# Patient Record
Sex: Female | Born: 1950 | Race: White | Hispanic: No | Marital: Single | Smoking: Never smoker
Health system: Southern US, Community
[De-identification: ages and names within clinical notes are randomized; demographics above are authoritative.]

## PROBLEM LIST (undated history)

## (undated) DIAGNOSIS — N393 Stress incontinence (female) (male): Secondary | ICD-10-CM

## (undated) DIAGNOSIS — D649 Anemia, unspecified: Secondary | ICD-10-CM

## (undated) DIAGNOSIS — I471 Supraventricular tachycardia, unspecified: Secondary | ICD-10-CM

## (undated) DIAGNOSIS — R9439 Abnormal result of other cardiovascular function study: Secondary | ICD-10-CM

## (undated) DIAGNOSIS — E785 Hyperlipidemia, unspecified: Secondary | ICD-10-CM

## (undated) DIAGNOSIS — I10 Essential (primary) hypertension: Secondary | ICD-10-CM

## (undated) DIAGNOSIS — E119 Type 2 diabetes mellitus without complications: Secondary | ICD-10-CM

## (undated) DIAGNOSIS — G709 Myoneural disorder, unspecified: Secondary | ICD-10-CM

## (undated) DIAGNOSIS — M797 Fibromyalgia: Secondary | ICD-10-CM

## (undated) DIAGNOSIS — M1990 Unspecified osteoarthritis, unspecified site: Secondary | ICD-10-CM

## (undated) DIAGNOSIS — F419 Anxiety disorder, unspecified: Secondary | ICD-10-CM

## (undated) DIAGNOSIS — R002 Palpitations: Secondary | ICD-10-CM

## (undated) DIAGNOSIS — K219 Gastro-esophageal reflux disease without esophagitis: Secondary | ICD-10-CM

## (undated) DIAGNOSIS — C189 Malignant neoplasm of colon, unspecified: Secondary | ICD-10-CM

## (undated) DIAGNOSIS — Z6841 Body Mass Index (BMI) 40.0 and over, adult: Secondary | ICD-10-CM

## (undated) DIAGNOSIS — M8580 Other specified disorders of bone density and structure, unspecified site: Secondary | ICD-10-CM

## (undated) HISTORY — DX: Abnormal result of other cardiovascular function study: R94.39

## (undated) HISTORY — DX: Other specified disorders of bone density and structure, unspecified site: M85.80

## (undated) HISTORY — DX: Supraventricular tachycardia, unspecified: I47.10

## (undated) HISTORY — DX: Essential (primary) hypertension: I10

## (undated) HISTORY — DX: Hyperlipidemia, unspecified: E78.5

## (undated) HISTORY — DX: Palpitations: R00.2

## (undated) HISTORY — PX: BUNIONECTOMY: SHX129

## (undated) HISTORY — DX: Gastro-esophageal reflux disease without esophagitis: K21.9

## (undated) HISTORY — DX: Unspecified osteoarthritis, unspecified site: M19.90

## (undated) HISTORY — DX: Type 2 diabetes mellitus without complications: E11.9

## (undated) HISTORY — DX: Body Mass Index (BMI) 40.0 and over, adult: Z684

## (undated) HISTORY — PX: SHOULDER SURGERY: SHX246

## (undated) HISTORY — PX: NM PET IMG ALZHEIMERS: HXRAD13

## (undated) HISTORY — DX: Myoneural disorder, unspecified: G70.9

## (undated) HISTORY — DX: Supraventricular tachycardia: I47.1

## (undated) HISTORY — DX: Morbid (severe) obesity due to excess calories: E66.01

---

## 1999-07-27 ENCOUNTER — Other Ambulatory Visit: Admission: RE | Admit: 1999-07-27 | Discharge: 1999-07-27 | Payer: Self-pay | Admitting: Obstetrics & Gynecology

## 2000-02-09 ENCOUNTER — Encounter: Payer: Self-pay | Admitting: Emergency Medicine

## 2000-02-09 ENCOUNTER — Emergency Department (HOSPITAL_COMMUNITY): Admission: EM | Admit: 2000-02-09 | Discharge: 2000-02-09 | Payer: Self-pay | Admitting: Emergency Medicine

## 2000-10-03 ENCOUNTER — Other Ambulatory Visit: Admission: RE | Admit: 2000-10-03 | Discharge: 2000-10-03 | Payer: Self-pay | Admitting: Obstetrics & Gynecology

## 2001-09-21 ENCOUNTER — Other Ambulatory Visit: Admission: RE | Admit: 2001-09-21 | Discharge: 2001-09-21 | Payer: Self-pay | Admitting: Dermatology

## 2001-10-25 ENCOUNTER — Other Ambulatory Visit: Admission: RE | Admit: 2001-10-25 | Discharge: 2001-10-25 | Payer: Self-pay | Admitting: Obstetrics & Gynecology

## 2004-05-14 ENCOUNTER — Other Ambulatory Visit: Admission: RE | Admit: 2004-05-14 | Discharge: 2004-05-14 | Payer: Self-pay | Admitting: Obstetrics & Gynecology

## 2005-05-29 ENCOUNTER — Emergency Department (HOSPITAL_COMMUNITY): Admission: EM | Admit: 2005-05-29 | Discharge: 2005-05-29 | Payer: Self-pay | Admitting: Emergency Medicine

## 2005-06-23 ENCOUNTER — Other Ambulatory Visit: Admission: RE | Admit: 2005-06-23 | Discharge: 2005-06-23 | Payer: Self-pay | Admitting: Obstetrics & Gynecology

## 2006-01-12 ENCOUNTER — Ambulatory Visit: Payer: Self-pay | Admitting: Internal Medicine

## 2008-11-16 ENCOUNTER — Emergency Department (HOSPITAL_COMMUNITY): Admission: EM | Admit: 2008-11-16 | Discharge: 2008-11-16 | Payer: Self-pay | Admitting: Emergency Medicine

## 2011-03-03 HISTORY — PX: DOPPLER ECHOCARDIOGRAPHY: SHX263

## 2012-08-29 ENCOUNTER — Ambulatory Visit: Payer: BC Managed Care – PPO | Attending: Neurosurgery

## 2012-08-29 DIAGNOSIS — M25559 Pain in unspecified hip: Secondary | ICD-10-CM | POA: Insufficient documentation

## 2012-08-29 DIAGNOSIS — M545 Low back pain, unspecified: Secondary | ICD-10-CM | POA: Insufficient documentation

## 2012-08-29 DIAGNOSIS — IMO0001 Reserved for inherently not codable concepts without codable children: Secondary | ICD-10-CM | POA: Insufficient documentation

## 2012-08-29 DIAGNOSIS — M2560 Stiffness of unspecified joint, not elsewhere classified: Secondary | ICD-10-CM | POA: Insufficient documentation

## 2012-08-31 ENCOUNTER — Ambulatory Visit: Payer: BC Managed Care – PPO | Admitting: Physical Therapy

## 2012-09-05 ENCOUNTER — Ambulatory Visit: Payer: BC Managed Care – PPO | Admitting: Physical Therapy

## 2012-09-07 ENCOUNTER — Ambulatory Visit: Payer: BC Managed Care – PPO | Admitting: *Deleted

## 2012-09-12 ENCOUNTER — Ambulatory Visit: Payer: BC Managed Care – PPO | Attending: Neurosurgery | Admitting: *Deleted

## 2012-09-12 DIAGNOSIS — E119 Type 2 diabetes mellitus without complications: Secondary | ICD-10-CM | POA: Insufficient documentation

## 2012-09-12 DIAGNOSIS — M545 Low back pain, unspecified: Secondary | ICD-10-CM | POA: Insufficient documentation

## 2012-09-12 DIAGNOSIS — E1169 Type 2 diabetes mellitus with other specified complication: Secondary | ICD-10-CM | POA: Insufficient documentation

## 2012-09-12 DIAGNOSIS — IMO0001 Reserved for inherently not codable concepts without codable children: Secondary | ICD-10-CM | POA: Insufficient documentation

## 2012-09-12 DIAGNOSIS — M2560 Stiffness of unspecified joint, not elsewhere classified: Secondary | ICD-10-CM | POA: Insufficient documentation

## 2012-09-12 DIAGNOSIS — M25559 Pain in unspecified hip: Secondary | ICD-10-CM | POA: Insufficient documentation

## 2012-09-12 HISTORY — DX: Type 2 diabetes mellitus without complications: E11.9

## 2012-09-14 ENCOUNTER — Ambulatory Visit: Payer: BC Managed Care – PPO | Admitting: Physical Therapy

## 2012-09-19 ENCOUNTER — Ambulatory Visit: Payer: BC Managed Care – PPO | Admitting: *Deleted

## 2012-09-21 ENCOUNTER — Ambulatory Visit: Payer: BC Managed Care – PPO | Admitting: Physical Therapy

## 2012-09-25 ENCOUNTER — Ambulatory Visit: Payer: BC Managed Care – PPO | Admitting: Physical Therapy

## 2012-09-25 ENCOUNTER — Ambulatory Visit: Payer: Self-pay | Admitting: Cardiology

## 2012-09-27 ENCOUNTER — Ambulatory Visit: Payer: BC Managed Care – PPO | Admitting: *Deleted

## 2012-09-28 ENCOUNTER — Ambulatory Visit (INDEPENDENT_AMBULATORY_CARE_PROVIDER_SITE_OTHER): Payer: BC Managed Care – PPO | Admitting: Cardiology

## 2012-09-28 ENCOUNTER — Encounter: Payer: Self-pay | Admitting: Cardiology

## 2012-09-28 VITALS — BP 140/86 | HR 86 | Ht 61.0 in | Wt 238.5 lb

## 2012-09-28 DIAGNOSIS — I471 Supraventricular tachycardia, unspecified: Secondary | ICD-10-CM

## 2012-09-28 DIAGNOSIS — I10 Essential (primary) hypertension: Secondary | ICD-10-CM

## 2012-09-28 DIAGNOSIS — R002 Palpitations: Secondary | ICD-10-CM

## 2012-09-28 DIAGNOSIS — Z6841 Body Mass Index (BMI) 40.0 and over, adult: Secondary | ICD-10-CM

## 2012-09-28 DIAGNOSIS — E119 Type 2 diabetes mellitus without complications: Secondary | ICD-10-CM

## 2012-09-28 MED ORDER — METOPROLOL TARTRATE 25 MG PO TABS: 25.0000 mg | ORAL_TABLET | Freq: Two times a day (BID) | ORAL | Status: DC

## 2012-09-28 NOTE — Patient Instructions (Addendum)
Now that your blood pressure is a bit higher than it was last year, we have room to use the Metoprolol that I was hoping to use last year.  This should help both your blood pressure & your palpitations.   If you continue to be stable, we will see you back in 1 year, if any new symptoms arise, or if your palpitations / fast heart rate episodes worsen, please do not hesitate to call.  Marykay Lex, MD

## 2012-09-29 ENCOUNTER — Encounter: Payer: Self-pay | Admitting: Cardiology

## 2012-09-29 DIAGNOSIS — I10 Essential (primary) hypertension: Secondary | ICD-10-CM | POA: Insufficient documentation

## 2012-09-29 DIAGNOSIS — R002 Palpitations: Secondary | ICD-10-CM | POA: Insufficient documentation

## 2012-09-29 DIAGNOSIS — I471 Supraventricular tachycardia: Secondary | ICD-10-CM | POA: Insufficient documentation

## 2012-10-01 ENCOUNTER — Encounter: Payer: Self-pay | Admitting: Cardiology

## 2012-10-01 NOTE — Assessment & Plan Note (Signed)
Her BP is a little elevated today for risk factors. Restart the beta blocker should help. She is otherwise on an ARB/HCTZ combo med. This is also followed by her primary physician.

## 2012-10-01 NOTE — Assessment & Plan Note (Signed)
Restart beta blocker

## 2012-10-01 NOTE — Assessment & Plan Note (Signed)
She is having palpitations now, but does not note symptoms of the suggestive of a PSVT. No lightheadedness or dizziness or syncope/near-syncope with it. She is however noticing more palpitations, which may be PACs or PVCs that could trigger SVT. She had previously stopped her beta blocker, I will ask her to restart it.

## 2012-10-01 NOTE — Assessment & Plan Note (Signed)
Diana Davidson with her diet and exercise and weight loss. She was down in the 190s visit back last September. It is encouraging to get back into the exercise and monitoring her diet. I think she may very well benefit from a nutrition specialist especially with her having diabetes. Examination of diabetes, morbid obesity and hypertension as well as dyslipidemia with low HDL would make the diagnosis of metabolic syndrome. This puts her at higher overall risk.

## 2012-10-01 NOTE — Progress Notes (Signed)
Patient ID: Diana Davidson, female   DOB: [birth year], 62 y.o.   MRN: [id number]  Clinic Note: HPI: Diana Davidson is a 62 y.o. morbidly obese female with a PMH below who presents today for followup of her CAD, cardiac arrhythmias and hypertension. She saw Mr. 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 She was told that she could stop her Plavix but recommend restarting aspirin. Had been working hard on her weight loss, but as he gained weight back recently. Last fall she was down at 196 pounds but is back up to a 38 currently.  Interval History: Diana Davidson continues to do relatively well overall. She says that her heart beats keeps skipping every now and then and she definitely notices them. She otherwise denies any true chest discomfort or pressure consistent with her angina with either rest or exertion. No shortness of breath with rest or exertion. No PND, orthopnea or edema. While she does have sensation of skipped heartbeats, she denies any rapid heart beats or syncope/near-syncope the symptoms. They do not make her feel lightheaded or dizzy, woozy. No TIA or amaurosis fugax symptoms. No melena, hematochezia or hematuria. No further bleeding her eyes.  Past Medical History  Diagnosis Date  . Diabetes mellitus without complication 09/2012    HgbA1c 6   . Osteopenia   . PSVT (paroxysmal supraventricular tachycardia)   . Heart palpitations     PSVT, PVCs, PACS - all short bursts, no syncope  . Hypertension   . Morbid obesity with BMI of 45.0-49.9, adult     Prior Cardiac Evaluation and Past Surgical History: Past Surgical History  Procedure Laterality Date  . Nm pet img alzheimers    . Doppler echocardiography  03/03/2011    EF greater than 55; Moderate concentric LVH, grade 1 1 diastolic dysfunction with mildly elevated LVEDP/LAP; RV pressure 30-40 mmHg;     Allergies  Allergen Reactions  . Penicillins   . Prednisone Other (See Comments)    Red flush area across her face   . Sulfa Antibiotics     Current Outpatient Prescriptions  Medication Sig Dispense Refill  . Calcium Citrate-Vitamin D (CALCIUM CITRATE + D3 PO) Take by mouth daily.      Marland Kitchen CETIRIZINE HCL PO Take 10 mg by mouth.      . Cholecalciferol (VITAMIN D-3) 1000 UNITS CAPS Take by mouth daily.      . Coenzyme Q10 (CO Q 10) 100 MG CAPS Take by mouth daily.      . DULoxetine (CYMBALTA) 60 MG capsule Take 60 mg by mouth daily.      Marland Kitchen esomeprazole (NEXIUM) 40 MG capsule Take 40 mg by mouth daily before breakfast.      . losartan-hydrochlorothiazide (HYZAAR) 50-12.5 MG per tablet Take 1 tablet by mouth daily.      . Omega-3 Krill Oil 300 MG CAPS Take by mouth daily.      . simvastatin (ZOCOR) 20 MG tablet Take 20 mg by mouth every evening.      Marland Kitchen LORazepam (ATIVAN) 1 MG tablet        No current facility-administered medications for this visit.   Social History Narrative   She is a single woman. 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 She does walk, walking her   dog, but has been bothered recently by her knee. She does not smoke, does not drink. She walks her dog about 4-5 days a week and does this for about 30  minutes a day.    ROS: A comprehensive Review of Systems - Negative except Pertinent positives above. She is a stenosis is not really been out about and exercising as much as she used to.  PHYSICAL EXAM BP 140/86  Pulse 86  Ht 5\' 1"  (1.549 m)  Wt 238 lb 8 oz (108.183 kg)  BMI 45.09 kg/m2 General appearance: alert, cooperative, appears stated age, no distress, morbidly obese and well-groomed, answers questions appropriately. Pleasant mood and affect Neck: no adenopathy, no carotid bruit, no JVD, supple, symmetrical, trachea midline and thyroid not enlarged, symmetric, no tenderness/mass/nodules Lungs: clear to auscultation bilaterally, normal percussion bilaterally and Nonlabored, good air movement Heart: regular rate and rhythm, S1, S2 normal, no murmur, click, rub or gallop, normal apical  impulse and occasional ectopy Abdomen: soft, non-tender; bowel sounds normal; no masses,  no organomegaly and Morbidly obese Extremities: extremities normal, atraumatic, no cyanosis or edema Pulses: 2+ and symmetric Neurologic: Alert and oriented X 3, normal strength and tone. Normal symmetric reflexes. Normal coordination and gait  UYQ:[id number] today: Yes Rate:86  , Rhythm: Normal sinus, normal ECG   Recent Labs: none   ASSESSMENT / PLAN:  PSVT (paroxysmal supraventricular tachycardia) - history of She is having palpitations now, but does not note symptoms of the suggestive of a PSVT. No lightheadedness or dizziness or syncope/near-syncope with it. She is however noticing more palpitations, which may be PACs or PVCs that could trigger SVT. She had previously stopped her beta blocker, I will ask her to restart it.  Heart palpitations Restart beta blocker  Hypertension Her BP is a little elevated today for risk factors. Restart the beta blocker should help. She is otherwise on an ARB/HCTZ combo med. This is also followed by her primary physician.  Morbid obesity with BMI of 45.0-49.9, adult Diana Davidson with her diet and exercise and weight loss. She was down in the 190s visit back last September. It is encouraging to get back into the exercise and monitoring her diet. I think she may very well benefit from a nutrition specialist especially with her having diabetes. Examination of diabetes, morbid obesity and hypertension as well as dyslipidemia with low HDL would make the diagnosis of metabolic syndrome. This puts her at higher overall risk.   Per problem list. Orders Placed This Encounter  Procedures  . EKG 12-Lead   . metoprolol tartrate (LOPRESSOR) 25 MG tablet Take 1 tablet (25 mg total) by mouth 2 (two) times daily.  60 tablet  12    Followup:  1 year   Diana Davidson W. Herbie Baltimore, M.D., M.S. [facility] [address] [place], [place]   [address]  [phone] Pager # [phone]

## 2012-10-02 ENCOUNTER — Ambulatory Visit: Payer: BC Managed Care – PPO | Admitting: Physical Therapy

## 2012-10-04 ENCOUNTER — Ambulatory Visit: Payer: BC Managed Care – PPO | Admitting: Physical Therapy

## 2013-04-25 ENCOUNTER — Encounter (INDEPENDENT_AMBULATORY_CARE_PROVIDER_SITE_OTHER): Payer: Self-pay

## 2013-04-25 ENCOUNTER — Other Ambulatory Visit (INDEPENDENT_AMBULATORY_CARE_PROVIDER_SITE_OTHER): Payer: Self-pay | Admitting: General Surgery

## 2013-04-25 ENCOUNTER — Encounter (INDEPENDENT_AMBULATORY_CARE_PROVIDER_SITE_OTHER): Payer: Self-pay | Admitting: General Surgery

## 2013-04-25 ENCOUNTER — Ambulatory Visit (INDEPENDENT_AMBULATORY_CARE_PROVIDER_SITE_OTHER): Payer: BC Managed Care – PPO | Admitting: General Surgery

## 2013-04-25 VITALS — BP 140/90 | HR 68 | Temp 97.4°F | Resp 14 | Ht 61.0 in | Wt 260.0 lb

## 2013-04-25 DIAGNOSIS — C189 Malignant neoplasm of colon, unspecified: Secondary | ICD-10-CM

## 2013-04-25 NOTE — Patient Instructions (Signed)
[facility]  ONE-DAY (1) PRE-OP HOME COLON PREP INSTRUCTIONS: ** MIRALAX / GATORADE PREP **  Fill the two prescriptions at a pharmacy of your choice.  You must follow the instructions below carefully.  If you have questions or problems, please call and speak to someone in the clinic department at our office:   [phone].  MIRALAX - GATORADE -- DULCOLAX TABS:   Fill the prescriptions for MIRALAX  (255 gm bottle)    In addition, purchase four (4) DULCOLAX TABLETS (no prescription required), and one 64 oz GATORADE.  (Do NOT purchase red Gatorade; any other flavor is acceptable).  ANITIBIOTICS:   There will be 2 different antibiotics.     Take both prescriptions THE AFTERNOON BEFORE your surgery, at the times written on the bottles.  INSTRUCTIONS: 1. Five days prior to your procedure do not eat nuts, popcorn, or fruit with seeds.  Stop all fiber supplements such as Metamucil, Citrucel, etc.  2. The day before your procedure: o 6:00am:  take (4) Dulcolax tablets.  You should remain on clear liquids for the entire day.   CLEAR LIQUIDS: clear bouillon, broth, jello (NOT RED), black coffee, tea, soda, etc o 10:00am:  add the bottle of MiraLax to the 64-oz bottle of Gatorade, and dissolve.  Begin drinking the Gatorade mixture until gone (8 oz every 15-30 minutes).  Continue clear liquids until midnight (or bedtime). o Take the antibiotics at the times instructed on the bottles.  3. The day of your procedure:   Do not eat or drink ANYTHING after midnight before your surgery.     If you take Heart or Blood Pressure medicine, ask the pre-op nurses about these during your preop appointment.   Further pre-operative instructions will be given to you from the hospital.   Expect to be contacted 5-7 days before your surgery.       

## 2013-04-25 NOTE — Progress Notes (Signed)
Patient ID: [name], female   DOB: 29-Aug-1950, 63 y.o.   MRN: [id number]  Chief Complaint  Patient presents with  . New Evaluation    eval sigmoid colon polyp inv cnacer left colon resection    HPI Diana Davidson is a 63 y.o. female.   HPI  She is referred by Dr. Teena Irani because of a recent diagnosis of left colon cancer. She was noted to have a very mild anemia and Hemoccult positive stools. Upper and lower endoscopy were performed. 35 cm from the anal verge was a 12 mm polyp that was removed. Adenocarcinoma of was noted in the polyp. Could not tell if margins were clear. There was submucosal invasion.  No first or second-degree relative history of colon cancer.  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  She would need to get her mother placed in hospice or nursing home prior to having any surgery.  Past Medical History  Diagnosis Date  . Diabetes mellitus without complication 0/6237    SEGB1D 6   . Osteopenia   . PSVT (paroxysmal supraventricular tachycardia)   . Heart palpitations     PSVT, PVCs, PACS - all short bursts, no syncope  . Hypertension   . Morbid obesity with BMI of 45.0-49.9, adult   . Arthritis   . GERD (gastroesophageal reflux disease)   . Hyperlipidemia   . Neuromuscular disorder     Past Surgical History  Procedure Laterality Date  . Nm pet img alzheimers    . Doppler echocardiography  03/03/2011    EF greater than 55; Moderate concentric LVH, grade 1 1 diastolic dysfunction with mildly elevated LVEDP/LAP; RV pressure 30-40 mmHg;     Family History  Problem Relation Age of Onset  . Diabetes Mother   . Hypertension Mother   . Alzheimer's disease Mother   . Cancer Paternal Aunt     ovarian    Social History History  Substance Use Topics  . Smoking status: Never Smoker   . Smokeless tobacco: Not on file  . Alcohol Use: No    Allergies  Allergen Reactions  . Penicillins   . Prednisone Other (See  Comments)    Red flush area across her face  . Sulfa Antibiotics     Current Outpatient Prescriptions  Medication Sig Dispense Refill  . Calcium Citrate-Vitamin D (CALCIUM CITRATE + D3 PO) Take by mouth daily.      Marland Kitchen CETIRIZINE HCL PO Take 10 mg by mouth.      . Cholecalciferol (VITAMIN D-3) 1000 UNITS CAPS Take by mouth daily.      . Coenzyme Q10 (CO Q 10) 100 MG CAPS Take by mouth daily.      . DULoxetine (CYMBALTA) 60 MG capsule Take 60 mg by mouth daily.      Marland Kitchen esomeprazole (NEXIUM) 40 MG capsule Take 40 mg by mouth daily before breakfast.      . LORazepam (ATIVAN) 1 MG tablet       . losartan-hydrochlorothiazide (HYZAAR) 50-12.5 MG per tablet Take 1 tablet by mouth daily.      . metoprolol tartrate (LOPRESSOR) 25 MG tablet Take 1 tablet (25 mg total) by mouth 2 (two) times daily.  60 tablet  12  . Omega-3 Krill Oil 300 MG CAPS Take by mouth daily.      . simvastatin (ZOCOR) 20 MG tablet Take 20 mg by mouth every evening.       No  current facility-administered medications for this visit.    Review of Systems Review of Systems  Constitutional: Negative.   HENT: Positive for congestion.   Respiratory: Negative.   Cardiovascular: Positive for palpitations.  Gastrointestinal: Negative.   Genitourinary: Negative.   Musculoskeletal: Positive for arthralgias.  Neurological: Negative.   Hematological: Negative.     Blood pressure 140/90, pulse 68, temperature 97.4 F (36.3 C), temperature source Oral, resp. rate 14, height 5\' 1"  (1.549 m), weight 260 lb (117.935 kg).  Physical Exam Physical Exam  Constitutional:  Morbidly obese female in no acute distress. She is here with her friend.  HENT:  Head: Normocephalic and atraumatic.  Eyes: EOM are normal. No scleral icterus.  Neck: Neck supple.  Cardiovascular: Normal rate and regular rhythm.   Pulmonary/Chest: Effort normal and breath sounds normal.  Abdominal: Soft. She exhibits no distension and no mass. There is no  tenderness.  obese  Musculoskeletal: Normal range of motion. She exhibits edema.  Lymphadenopathy:    She has no cervical adenopathy.  Neurological: She is alert.  Skin: Skin is warm and dry.  Psychiatric: She has a normal mood and affect. Her behavior is normal.    Data Reviewed Colonoscopy report. Pathology report. Notes from Dr. Amedeo Plenty  Assessment    1.  Newly diagnosed adenocarcinoma of the left colon.  2.History of PSVT followed by Dr. Ellyn Hack.  3. Elevated hemoglobin A1c concerning for diabetes type 2  4. Morbid obesity     Plan    CT scan of chest, abdomen, and pelvis for staging. I have discussed tattooing the area of the polypectomy with Dr. Amedeo Plenty and he is going to arrange for this. Will let her arrange for placement of her mother. Following this we discussed laparoscopic possible open partial colectomy.  I have explained the procedure and risks of colon resection.  Risks include but are not limited to bleeding, infection, wound problems, anesthesia, anastomotic leak, need for colostomy, injury to intraabominal organs (such as intestine, spleen, kidney, bladder, ureter, etc.), ileus, irregular bowel habits.  Given her morbid obesity and likely diabetes, I told her some of these risks are increased.  She seems to understand and agrees with the plan.  Once she has made arrangements for her mother, I have asked her to call us back so we can arrange to schedule her surgery. We'll also refer her to Dr. Ellyn Hack for preoperative cardiac assessment.       [name] J 04/25/2013, 1:07 PM

## 2013-04-26 ENCOUNTER — Telehealth (INDEPENDENT_AMBULATORY_CARE_PROVIDER_SITE_OTHER): Payer: Self-pay | Admitting: *Deleted

## 2013-04-26 NOTE — Telephone Encounter (Signed)
Pt returned my call and was given appt information below.  She is agreeable with this appt.

## 2013-04-26 NOTE — Telephone Encounter (Signed)
LM for pt to return my call.  I was calling to inform pt of the appt for her CT scan at [facility] on 05/01/13 with an arrival time of 1:30pm.  She is to drink the 1st bottle of contrast at 12:00pm and the 2nd bottle at 1:00pm.  No solid foods after 10:00am.

## 2013-05-01 ENCOUNTER — Other Ambulatory Visit: Payer: BC Managed Care – PPO

## 2013-05-03 ENCOUNTER — Encounter: Payer: Self-pay | Admitting: Physician Assistant

## 2013-05-03 ENCOUNTER — Ambulatory Visit (INDEPENDENT_AMBULATORY_CARE_PROVIDER_SITE_OTHER): Payer: BC Managed Care – PPO | Admitting: Physician Assistant

## 2013-05-03 VITALS — BP 132/70 | HR 88 | Ht 62.0 in | Wt 256.0 lb

## 2013-05-03 DIAGNOSIS — Z6841 Body Mass Index (BMI) 40.0 and over, adult: Secondary | ICD-10-CM

## 2013-05-03 DIAGNOSIS — R002 Palpitations: Secondary | ICD-10-CM

## 2013-05-03 DIAGNOSIS — Z01818 Encounter for other preprocedural examination: Secondary | ICD-10-CM | POA: Insufficient documentation

## 2013-05-03 DIAGNOSIS — I10 Essential (primary) hypertension: Secondary | ICD-10-CM

## 2013-05-03 NOTE — Assessment & Plan Note (Signed)
We discussed referral  For Medical nutritional therapy.

## 2013-05-03 NOTE — Assessment & Plan Note (Signed)
Controlled on Lopressor

## 2013-05-03 NOTE — Assessment & Plan Note (Signed)
The patient be scheduled for LexiScan Myoview.

## 2013-05-03 NOTE — Assessment & Plan Note (Signed)
Pressures controlled at this time

## 2013-05-03 NOTE — Patient Instructions (Signed)
1.  Lexiscan stress test. 2.  Follow up with Dr. Ellyn Hack in 6 months or sooner if needed. 3.  Dietary referral.

## 2013-05-03 NOTE — Progress Notes (Signed)
Date:  05/03/2013   ID:  [name], DOB 1951-02-26, MRN [id number]  PCP:  Geoffery Lyons, MD  Primary Cardiologist:  harding     History of Present Illness: Diana Davidson is a 63 y.o. female Diana Davidson is a 63 y.o. 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  Patient reports she had a colonoscopy recently which revealed a cancerous polyp. This was removed but she may need a partial colectomy.  She occasionally feels a skipped beat but not anywhere near the frequency that she use to when not on metoprolol.  She also reports that her father had a coronary artery artery bypass grafting with redo grafting initially it age 76. He's also had peripheral vascular disease with carotid endarterectomy diabetes and hyperlipidemia.  She denies any anginal symptoms but does get short of breath with exertion.  She did notice some lower extremity edema which improved overnight and was likely exacerbated after eating salty crackers/nabs.  The patient currently denies nausea, vomiting, fever, chest pain, orthopnea, dizziness, PND, cough, congestion, abdominal pain, hematochezia, melena, lower extremity edema, claudication.  Wt Readings from Last 3 Encounters:  05/03/13 256 lb (116.121 kg)  04/25/13 260 lb (117.935 kg)  09/28/12 238 lb 8 oz (108.183 kg)     Past Medical History  Diagnosis Date  . Diabetes mellitus without complication 05/1538    GQQP6P 6   . Osteopenia   . PSVT (paroxysmal supraventricular tachycardia)   . Heart palpitations     PSVT, PVCs, PACS - all short bursts, no syncope  . Hypertension   . Morbid obesity with BMI of 45.0-49.9, adult   . Arthritis   . GERD (gastroesophageal reflux disease)   . Hyperlipidemia   . Neuromuscular disorder     Current Outpatient Prescriptions  Medication Sig Dispense Refill  . Calcium Citrate-Vitamin D (CALCIUM CITRATE + D3 PO) Take by  mouth daily.      Marland Kitchen CETIRIZINE HCL PO Take 10 mg by mouth.      . Cholecalciferol (VITAMIN D-3) 1000 UNITS CAPS Take by mouth daily.      . Coenzyme Q10 (CO Q 10) 100 MG CAPS Take by mouth daily.      . DULoxetine (CYMBALTA) 60 MG capsule Take 60 mg by mouth daily.      Marland Kitchen esomeprazole (NEXIUM) 40 MG capsule Take 40 mg by mouth daily before breakfast.      . LORazepam (ATIVAN) 1 MG tablet       . losartan-hydrochlorothiazide (HYZAAR) 50-12.5 MG per tablet Take 1 tablet by mouth daily.      . metoprolol tartrate (LOPRESSOR) 25 MG tablet Take 1 tablet (25 mg total) by mouth 2 (two) times daily.  60 tablet  12  . Omega-3 Krill Oil 300 MG CAPS Take by mouth daily.      . simvastatin (ZOCOR) 20 MG tablet Take 20 mg by mouth every evening.       No current facility-administered medications for this visit.    Allergies:    Allergies  Allergen Reactions  . Penicillins   . Prednisone Other (See Comments)    Red flush area across her face  . Sulfa Antibiotics     Social History:  The patient  reports that she has never smoked. She does not have any smokeless tobacco history on file. She reports that she does not drink alcohol or  use illicit drugs.   Family history:   Family History  Problem Relation Age of Onset  . Diabetes Mother   . Hypertension Mother   . Alzheimer's disease Mother   . Cancer Paternal Aunt     ovarian    ROS:  Please see the history of present illness.  All other systems reviewed and negative.   PHYSICAL EXAM: VS:  BP 132/70  Pulse 88  Ht 5\' 2"  (1.575 m)  Wt 256 lb (116.121 kg)  BMI 46.81 kg/m2 Well nourished, well developed, in no acute distress HEENT: Pupils are equal round react to light accommodation extraocular movements are intact.  Neck: no JVDNo cervical lymphadenopathy. Cardiac: Regular rate and rhythm without murmurs rubs or gallops. Lungs:  clear to auscultation bilaterally, no wheezing, rhonchi or rales Abd: soft, nontender, positive bowel sounds  all quadrants, no hepatosplenomegaly Ext: no lower extremity edema.  2+ radial and dorsalis pedis pulses. Skin: warm and dry Neuro:  Grossly normal  EKG:  Normal sinus rhythm with a rate of 80 beats per minute    ASSESSMENT AND PLAN:  Problem List Items Addressed This Visit   Heart palpitations (Chronic)     Controlled on Lopressor    Hypertension (Chronic)     Pressures controlled at this time    Morbid obesity with BMI of 45.0-49.9, adult     We discussed referral  For Medical nutritional therapy.      Preoperative evaluation to rule out surgical contraindication     The patient be scheduled for LexiScan Myoview.     Other Visit Diagnoses   Pre-op evaluation    -  Primary    Relevant Orders       EKG 12-Lead       [facility]

## 2013-05-04 ENCOUNTER — Ambulatory Visit
Admission: RE | Admit: 2013-05-04 | Discharge: 2013-05-04 | Disposition: A | Discharge status: Home / Self Care | Payer: BC Managed Care – PPO | Source: Ambulatory Visit | Attending: General Surgery | Admitting: General Surgery

## 2013-05-04 DIAGNOSIS — C189 Malignant neoplasm of colon, unspecified: Secondary | ICD-10-CM

## 2013-05-04 MED ORDER — IOHEXOL 300 MG/ML  SOLN
125.0000 mL | Freq: Once | INTRAMUSCULAR | Status: AC | PRN
  Administered 2013-05-04: 125 mL via INTRAVENOUS

## 2013-05-04 MED ORDER — IOHEXOL 300 MG/ML  SOLN
30.0000 mL | Freq: Once | INTRAMUSCULAR | Status: AC | PRN
  Administered 2013-05-04: 30 mL via ORAL

## 2013-05-08 ENCOUNTER — Telehealth (INDEPENDENT_AMBULATORY_CARE_PROVIDER_SITE_OTHER): Payer: Self-pay | Admitting: General Surgery

## 2013-05-08 ENCOUNTER — Telehealth (INDEPENDENT_AMBULATORY_CARE_PROVIDER_SITE_OTHER): Payer: Self-pay

## 2013-05-08 NOTE — Telephone Encounter (Signed)
Notified the patient of her CT results.  She confirmed that she is scheduled to see Dr. Amedeo Plenty tomorrow for tatooing, and Dr. Ellyn Hack for her stress test on Thursday.  I told her we would be in touch with her after we see the results of her stress test.

## 2013-05-08 NOTE — Telephone Encounter (Signed)
Noted  

## 2013-05-08 NOTE — Telephone Encounter (Signed)
Pt called for CT results, wanting to know if there was mets.  Reviewed CT and updated her there was none.  She wanted Dr. Zella Richer to know she has appt Thursday for a stress test for Dr. Ellyn Hack and will hopefully be cleared for surgery then.  She hopes the coming snow storm will not delay the stress test.

## 2013-05-10 ENCOUNTER — Encounter (HOSPITAL_COMMUNITY): Payer: BC Managed Care – PPO

## 2013-05-11 ENCOUNTER — Telehealth (INDEPENDENT_AMBULATORY_CARE_PROVIDER_SITE_OTHER): Payer: Self-pay | Admitting: *Deleted

## 2013-05-11 NOTE — Telephone Encounter (Signed)
Pharmacist called to ask for clarification of antibiotic prescriptions given for before surgery.  She asked for a call back @ [phone] [place].

## 2013-05-14 ENCOUNTER — Telehealth (INDEPENDENT_AMBULATORY_CARE_PROVIDER_SITE_OTHER): Payer: Self-pay | Admitting: General Surgery

## 2013-05-14 NOTE — Telephone Encounter (Signed)
Pt reports she has had the needed "tattooing" done last Wednesday, but her appt for stress test on Thursday was snowed-out.  She is to go in tomorrow at 8:00am for that and will hopefully then get her cardiac clearance.  Explained that once [facility] has received the written clearance, then Dr. Zella Richer will send orders to the surgery scheduling team to get her on the operating schedule.  They will call her with the date, time and location.  She states she understands.

## 2013-05-15 ENCOUNTER — Encounter (INDEPENDENT_AMBULATORY_CARE_PROVIDER_SITE_OTHER): Payer: Self-pay

## 2013-05-15 ENCOUNTER — Ambulatory Visit (HOSPITAL_COMMUNITY)
Admission: RE | Admit: 2013-05-15 | Discharge: 2013-05-15 | Disposition: A | Discharge status: Home / Self Care | Payer: BC Managed Care – PPO | Source: Ambulatory Visit | Attending: Cardiovascular Disease | Admitting: Cardiovascular Disease

## 2013-05-15 DIAGNOSIS — Z01810 Encounter for preprocedural cardiovascular examination: Secondary | ICD-10-CM

## 2013-05-15 DIAGNOSIS — Z01818 Encounter for other preprocedural examination: Secondary | ICD-10-CM | POA: Insufficient documentation

## 2013-05-15 HISTORY — PX: NM MYOVIEW LTD: HXRAD82

## 2013-05-15 MED ORDER — REGADENOSON 0.4 MG/5ML IV SOLN
0.4000 mg | Freq: Once | INTRAVENOUS | Status: AC
  Administered 2013-05-15: 0.4 mg via INTRAVENOUS

## 2013-05-15 MED ORDER — TECHNETIUM TC 99M SESTAMIBI GENERIC - CARDIOLITE
30.0000 | Freq: Once | INTRAVENOUS | Status: AC | PRN
  Administered 2013-05-15: 30 via INTRAVENOUS

## 2013-05-15 MED ORDER — TECHNETIUM TC 99M SESTAMIBI GENERIC - CARDIOLITE
10.0000 | Freq: Once | INTRAVENOUS | Status: AC | PRN
  Administered 2013-05-15: 10 via INTRAVENOUS

## 2013-05-15 NOTE — Telephone Encounter (Signed)
Noted  

## 2013-05-15 NOTE — Progress Notes (Signed)
Patient ID: Diana Davidson, female   DOB: 01-Dec-1950, 63 y.o.   MRN: [id number] Verified with [facility] pt was seen on 05/03/13 by Dr. Jasmine December.  Spoke with Santiago Glad, and note will be faxed.

## 2013-05-15 NOTE — Procedures (Addendum)
[place] [address] [place] [address] [phone]  Cardiology Nuclear Med Study  Diana Davidson is a 63 y.o. female     MRN : [id number]     DOB: 1950/11/20  Procedure Date: 05/15/2013  Nuclear Med Background Indication for Stress Test:  Surgical Clearance History:  PSVT Cardiac Risk Factors: Family History - CAD, Hypertension, Lipids, NIDDM and Obesity  Symptoms:  DOE and Palpitations   Nuclear Pre-Procedure Caffeine/Decaff Intake:  7:00pm NPO After: 5:00am   IV Site: R Forearm  IV 0.9% NS with Angio Cath:  22g  Chest Size (in):  n/a IV Started by: Azucena Cecil, RN  Height: 5\' 2"  (1.575 m)  Cup Size: DD  BMI:  Body mass index is 46.81 kg/(m^2). Weight:  256 lb (116.121 kg)   Tech Comments:  n/a    Nuclear Med Study 1 or 2 day study: 1 day  Stress Test Type:  [place] [facility]  Order Authorizing Provider:  Glenetta Hew, MD   Resting Radionuclide: Technetium 74m Sestamibi  Resting Radionuclide Dose: 9.7 mCi   Stress Radionuclide:  Technetium 34m Sestamibi  Stress Radionuclide Dose: 29.8 mCi           Stress Protocol Rest HR: 73 Stress HR:100  Rest BP: 125/80 Stress BP: 125/80  Exercise Time (min): n/a METS: n/a          Dose of Adenosine (mg):  n/a Dose of Lexiscan: 0.4 mg  Dose of Atropine (mg): n/a Dose of Dobutamine: n/a mcg/kg/min (at max HR)  Stress Test Technologist: Mellody Memos, CCT Nuclear Technologist: Otho Perl, CNMT   Rest Procedure:  Myocardial perfusion imaging was performed at rest 45 minutes following the intravenous administration of Technetium 3m Sestamibi. Stress Procedure:  The patient received IV Lexiscan 0.4 mg over 15-seconds.  Technetium 72m Sestamibi injected at 30-seconds.  There were no significant changes with Lexiscan.  Quantitative spect images were obtained after a 45 minute delay.  Transient Ischemic Dilatation (Normal <1.22):  0.76 Lung/Heart Ratio (Normal <0.45):   0.29 QGS EDV:  64 ml QGS ESV:  16 ml LV Ejection Fraction: 75%  Rest ECG: NSR - Normal EKG  Stress ECG: No significant change from baseline ECG  QPS Raw Data Images:  Normal; no motion artifact; normal heart/lung ratio. Stress Images:  There is decreased uptake in the inferior wall. Rest Images:  Normal homogeneous uptake in all areas of the myocardium. Subtraction (SDS):  Small area of inferior reversibility.  Impression Exercise Capacity:  Lexiscan with no exercise. BP Response:  Normal blood pressure response. Clinical Symptoms:  There is dyspnea. ECG Impression:  No significant ECG changes with Lexiscan. Comparison with Prior Nuclear Study: No previous nuclear study performed  Overall Impression:  Intermediate risk stress nuclear study with a small area of inferior reversibility. There is underlying bowel in the stress image that is not present at rest, therefore, this may represent artifact. Clinical correlation is advised..  LV Wall Motion:  NL LV Function; NL Wall Motion; EF 75%  Pixie Casino, MD, [facility] Board Certified in Nuclear Cardiology Attending Cardiologist [place], MD  05/15/2013 1:11 PM

## 2013-05-16 ENCOUNTER — Encounter: Payer: Self-pay | Admitting: Physician Assistant

## 2013-05-16 ENCOUNTER — Telehealth: Payer: Self-pay | Admitting: *Deleted

## 2013-05-16 ENCOUNTER — Encounter (HOSPITAL_COMMUNITY): Payer: BC Managed Care – PPO

## 2013-05-16 NOTE — Telephone Encounter (Signed)
Error opened

## 2013-05-16 NOTE — Telephone Encounter (Signed)
SPOKE TO JENNY - LEFT MESSAGE TO CALL BACK-AS WELL  LEFT MESSAGE ON CELL

## 2013-05-16 NOTE — Telephone Encounter (Signed)
Spoke to patient. Informed patient abn.changes on myoview Dr Ellyn Hack would like to discuss results with her . appoinmtent schedule for 05/17/13 at 2 pm - she was in agreement

## 2013-05-16 NOTE — Telephone Encounter (Signed)
Message copied by Raiford Simmonds on Wed May 16, 2013  2:32 PM ------      Message from: Leonie Man      Created: Tue May 15, 2013  4:07 PM       Not really sure what to make of this test result.  Is read as Intermediate Risk which would suggest concern for possible heart artery blockage.  Unfortunately, the next step is definitive evaluation with a cardiac cath to delineate the anatomy.        With this reading, our hands are essentially tied.        Should probably have her return for a visit to discuss results & consider possible Cath.            Leonie Man, MD       ------

## 2013-05-17 ENCOUNTER — Encounter: Payer: Self-pay | Admitting: Cardiology

## 2013-05-17 ENCOUNTER — Encounter (HOSPITAL_COMMUNITY): Payer: Self-pay | Admitting: Pharmacy Technician

## 2013-05-17 ENCOUNTER — Ambulatory Visit (INDEPENDENT_AMBULATORY_CARE_PROVIDER_SITE_OTHER): Payer: BC Managed Care – PPO | Admitting: Cardiology

## 2013-05-17 ENCOUNTER — Encounter (HOSPITAL_COMMUNITY): Payer: BC Managed Care – PPO

## 2013-05-17 VITALS — BP 150/90 | HR 108 | Ht 62.0 in | Wt 261.3 lb

## 2013-05-17 DIAGNOSIS — E669 Obesity, unspecified: Secondary | ICD-10-CM

## 2013-05-17 DIAGNOSIS — I471 Supraventricular tachycardia: Secondary | ICD-10-CM

## 2013-05-17 DIAGNOSIS — I10 Essential (primary) hypertension: Secondary | ICD-10-CM

## 2013-05-17 DIAGNOSIS — Z01810 Encounter for preprocedural cardiovascular examination: Secondary | ICD-10-CM | POA: Insufficient documentation

## 2013-05-17 DIAGNOSIS — R9439 Abnormal result of other cardiovascular function study: Secondary | ICD-10-CM

## 2013-05-17 DIAGNOSIS — E1169 Type 2 diabetes mellitus with other specified complication: Secondary | ICD-10-CM

## 2013-05-17 DIAGNOSIS — R002 Palpitations: Secondary | ICD-10-CM

## 2013-05-17 DIAGNOSIS — D689 Coagulation defect, unspecified: Secondary | ICD-10-CM

## 2013-05-17 DIAGNOSIS — R931 Abnormal findings on diagnostic imaging of heart and coronary circulation: Secondary | ICD-10-CM | POA: Insufficient documentation

## 2013-05-17 DIAGNOSIS — E119 Type 2 diabetes mellitus without complications: Secondary | ICD-10-CM

## 2013-05-17 LAB — CBC
HCT: 33.5 % — ABNORMAL LOW (ref 36.0–46.0)
Hemoglobin: 10.9 g/dL — ABNORMAL LOW (ref 12.0–15.0)
MCH: 25.8 pg — ABNORMAL LOW (ref 26.0–34.0)
MCHC: 32.5 g/dL (ref 30.0–36.0)
MCV: 79.2 fL (ref 78.0–100.0)
Platelets: 391 10*3/uL (ref 150–400)
RBC: 4.23 MIL/uL (ref 3.87–5.11)
RDW: 15.3 % (ref 11.5–15.5)
WBC: 11.5 10*3/uL — AB (ref 4.0–10.5)

## 2013-05-17 NOTE — Assessment & Plan Note (Signed)
Much better control now she is back on her beta blocker. Tolerating without to much fatigue.

## 2013-05-17 NOTE — Assessment & Plan Note (Signed)
Well-controlled on Lopressor. As far as I can tell no further bursts of SVT that she consents.

## 2013-05-17 NOTE — Assessment & Plan Note (Signed)
Pending the results of her cardiac catheterization, will be able to determine her the operative risk for her intraperitoneal surgery which would be considered a high-risk surgery. If her catheterization does not show any instructed coronary disease, she should be all proceed with her catheterization without any further evaluation. She is on a beta blocker which is controlled her palpitations and will mitigate cardiac risk.   The question will be what we do if we do find any concerning lesion. If PCI is warranted, I would consider bare-metal stent placement which would delay her surgery by a minimum of 1 month.

## 2013-05-17 NOTE — Assessment & Plan Note (Addendum)
Unfortunately, she really doesn't have much in the way of symptoms to suggest an anginal above and beyond her exertional dyspnea. Despite this, she has a stress test that was read as intermediate risk. I think the only way to clarify her anatomy is to proceed with cardiac catheterization.  Plan: Cardiac Catheterization via Right Radial approach with Possible PCI  I explained the procedure as well as risks, benefits, alternatives and indications to the patient and her close friend.  All questions were answered.    Risks / Complications include, but not limited to: Death, MI, CVA/TIA, VF/VT (with defibrillation), Bradycardia (need for temporary pacer placement), contrast induced nephropathy, bleeding / bruising / hematoma / pseudoaneurysm, vascular or coronary injury (with possible emergent CT or Vascular Surgery), adverse medication reactions, infection.    The patient (and friend) voice understanding and agree to proceed.   The plan will be to proceed with radial access if PCI is required, I would strongly consider using a bare-metal stent in order to not prolong her upcoming surgery and he longer to I would investigate a moderate lesion would be FFR prior to PCI if question of its physiologic significance of the lesion.

## 2013-05-17 NOTE — Assessment & Plan Note (Addendum)
Well-controlled diabetes.  Not currently on a insulin.

## 2013-05-17 NOTE — Patient Instructions (Signed)
Your physician has requested that you have a cardiac catheterization. Cardiac catheterization is used to diagnose and/or treat various heart conditions. Doctors may recommend this procedure for a number of different reasons. The most common reason is to evaluate chest pain. Chest pain can be a symptom of coronary artery disease (CAD), and cardiac catheterization can show whether plaque is narrowing or blocking your heart's arteries. This procedure is also used to evaluate the valves, as well as measure the blood flow and oxygen levels in different parts of your heart. For further information please visit [email or web address]. Please follow instruction sheet, as given.  Your physician recommends that you return for lab work before your catheterization

## 2013-05-17 NOTE — Progress Notes (Signed)
PCP: Geoffery Lyons, MD  Clinic Note: Chief Complaint  Patient presents with  . Follow-up    result of myoview,no chest pain ,sob ,no edema    HPI: Diana Davidson is a 63 y.o. female with a Cardiovascular Problem List below who presents today for followup of preoperative cardiovascular exam stress test.  Interval History: Magdalyn previously been followed here for palpitations and history of PSVT. She has diabetes type 2 as well as hypertension and obesity. She is currently undergoing evaluation for partial sigmoid colectomy for concerning possible cancerous polyp. As I had last seen her over 6 months ago, I felt it best for her to be reevaluated preoperatively. She saw Tarri Fuller, PA on February 19. Due to concern of her significant family history of CAD with her father's coronary disease as well as her underlying risk factors with history of arrhythmia, he felt it best to evaluate her with a Myoview stress test. He is now presenting for the results, which were read as INTERMEDIATE RISK way of a small reversible area in the inferior wall that could possibly bowel artifact versus true ischemia. She does note some exertional dyspnea, but denies any chest tightness or pressure. She also has some mild lower extremity edema, but denies any PND or orthopnea. No significant or worrisome palpitations - no tachycardia palpitations. No lightheadedness, dizziness, weakness or syncope/near syncope. No TIA/amaurosis fugax symptoms. No melena, hematochezia hematuria. No claudication.  Past Medical History  Diagnosis Date  . Diabetes mellitus without complication 10/1273    [id number] 6   . Osteopenia   . PSVT (paroxysmal supraventricular tachycardia)   . Heart palpitations     PSVT, PVCs, PACS - all short bursts, no syncope  . Hypertension   . Morbid obesity with BMI of 45.0-49.9, adult   . Arthritis   . GERD (gastroesophageal reflux disease)   . Hyperlipidemia   . Neuromuscular disorder      Prior Cardiac Evaluation and Past Surgical History: Past Surgical History  Procedure Laterality Date  . Nm pet img alzheimers    . Doppler echocardiography  03/03/2011    EF greater than 55; Moderate concentric LVH, grade 1 1 diastolic dysfunction with mildly elevated LVEDP/LAP; RV pressure 30-40 mmHg;   . Nm myoview ltd  05/15/2013    INTERMEDIATE RISK - small area of inferior reversibility suggestive of possible ischemia. Cannot exclude bowel artifact.    MEDICATIONS AND ALLERGIES REVIEWED IN EPIC No Change in Social and Family History  ROS: A comprehensive Review of Systems - Negative except Expected anxiety for upcoming surgery and now with the results of the stress test. notably no fevers chills or sweats. Mild musculoskeletal aches and pains from osteoarthritis.  PHYSICAL EXAM BP 150/90  Pulse 108  Ht 5\' 2"  (1.575 m)  Wt 261 lb 4.8 oz (118.525 kg)  BMI 47.78 kg/m2 Filed Weights   05/17/13 1338  Weight: 261 lb 4.8 oz (118.525 kg)   General appearance: alert, cooperative, appears stated age, no distress and morbidly obese Neck: no adenopathy, no carotid bruit and no JVD Lungs: clear to auscultation bilaterally, normal percussion bilaterally and non-labored Heart: regular rate and rhythm, S1, S2 normal, no murmur, click, rub or gallop; unable to palpate PMI Abdomen: soft, non-tender; bowel sounds normal; no masses,  no organomegaly; significant truncal obesity Extremities: extremities normal, atraumatic, no cyanosis, or edema  Pulses: 2+ and symmetric; Neurologic: Mental status: Alert, oriented, thought content appropriate Cranial nerves: normal (II-XII grossly intact)  CBS:[id number] today: No  Recent Labs none available:   ASSESSMENT / PLAN: Abnormal nuclear cardiac imaging test - Intermediate Risk Unfortunately, she really doesn't have much in the way of symptoms to suggest an anginal above and beyond her exertional dyspnea. Despite this, she has a stress test that  was read as intermediate risk. I think the only way to clarify her anatomy is to proceed with cardiac catheterization.  Plan: Cardiac Catheterization via Right Radial approach with Possible PCI  I explained the procedure as well as risks, benefits, alternatives and indications to the patient and her close friend.  All questions were answered.    Risks / Complications include, but not limited to: Death, MI, CVA/TIA, VF/VT (with defibrillation), Bradycardia (need for temporary pacer placement), contrast induced nephropathy, bleeding / bruising / hematoma / pseudoaneurysm, vascular or coronary injury (with possible emergent CT or Vascular Surgery), adverse medication reactions, infection.    The patient (and friend) voice understanding and agree to proceed.   The plan will be to proceed with radial access if PCI is required, I would strongly consider using a bare-metal stent in order to not prolong her upcoming surgery and he longer to I would investigate a moderate lesion would be FFR prior to PCI if question of its physiologic significance of the lesion.   Preoperative cardiovascular examination Pending the results of her cardiac catheterization, will be able to determine her the operative risk for her intraperitoneal surgery which would be considered a high-risk surgery. If her catheterization does not show any instructed coronary disease, she should be all proceed with her catheterization without any further evaluation. She is on a beta blocker which is controlled her palpitations and will mitigate cardiac risk.   The question will be what we do if we do find any concerning lesion. If PCI is warranted, I would consider bare-metal stent placement which would delay her surgery by a minimum of 1 month.  Heart palpitations Well-controlled on Lopressor. As far as I can tell no further bursts of SVT that she consents.  Diabetes mellitus type 2 in obese Well-controlled diabetes.  Not currently on a  insulin.  PSVT (paroxysmal supraventricular tachycardia) - history of Much better control now she is back on her beta blocker. Tolerating without to much fatigue.  Hypertension Pressures a little high today. I think we'll see how she does with a catheterization and what her blood pressures are. I think she is notably anxious and stressed today.    Orders Placed This Encounter  Procedures  . CBC  . Basic Metabolic Panel (BMET)  . INR/PT  . PTT  . LEFT HEART CATHETERIZATION WITH CORONARY ANGIOGRAM   No orders of the defined types were placed in this encounter.    Followup:1 month; post-cath  Rayne Loiseau W. Ellyn Hack, M.D., M.S. Interventional Cardiologist [facility]

## 2013-05-17 NOTE — Assessment & Plan Note (Signed)
Pressures a little high today. I think we'll see how she does with a catheterization and what her blood pressures are. I think she is notably anxious and stressed today.

## 2013-05-18 ENCOUNTER — Telehealth (INDEPENDENT_AMBULATORY_CARE_PROVIDER_SITE_OTHER): Payer: Self-pay

## 2013-05-18 LAB — BASIC METABOLIC PANEL
BUN: 14 mg/dL (ref 6–23)
CHLORIDE: 97 meq/L (ref 96–112)
CO2: 30 mEq/L (ref 19–32)
Calcium: 9.7 mg/dL (ref 8.4–10.5)
Creat: 0.91 mg/dL (ref 0.50–1.10)
Glucose, Bld: 137 mg/dL — ABNORMAL HIGH (ref 70–99)
Potassium: 4.3 mEq/L (ref 3.5–5.3)
Sodium: 138 mEq/L (ref 135–145)

## 2013-05-18 LAB — APTT: aPTT: 27 seconds (ref 24–37)

## 2013-05-18 LAB — PROTIME-INR
INR: 0.9 (ref <1.50)
Prothrombin Time: 12.1 seconds (ref 11.6–15.2)

## 2013-05-18 NOTE — Telephone Encounter (Signed)
LM for pt to call and ask for [facility]

## 2013-05-21 ENCOUNTER — Telehealth (INDEPENDENT_AMBULATORY_CARE_PROVIDER_SITE_OTHER): Payer: Self-pay

## 2013-05-21 NOTE — Telephone Encounter (Signed)
Pt has confirmed she is scheduled for a left heart cath procedure with Dr. Ellyn Hack tomorrow.  If all is ok, she will be cleared for surgery.  If not, Dr. Ellyn Hack will discuss placing a stent.  This would delay her surgery no less than 1 month.

## 2013-05-21 NOTE — Telephone Encounter (Signed)
Noted  

## 2013-05-22 ENCOUNTER — Encounter (HOSPITAL_COMMUNITY)
Admission: RE | Disposition: A | Discharge status: Home / Self Care | Payer: Self-pay | Source: Ambulatory Visit | Attending: Cardiology

## 2013-05-22 ENCOUNTER — Ambulatory Visit (HOSPITAL_COMMUNITY)
Admission: RE | Admit: 2013-05-22 | Discharge: 2013-05-22 | Disposition: A | Discharge status: Home / Self Care | Payer: BC Managed Care – PPO | Source: Ambulatory Visit | Attending: Cardiology | Admitting: Cardiology

## 2013-05-22 DIAGNOSIS — E669 Obesity, unspecified: Secondary | ICD-10-CM

## 2013-05-22 DIAGNOSIS — I471 Supraventricular tachycardia, unspecified: Secondary | ICD-10-CM | POA: Insufficient documentation

## 2013-05-22 DIAGNOSIS — R0609 Other forms of dyspnea: Secondary | ICD-10-CM | POA: Insufficient documentation

## 2013-05-22 DIAGNOSIS — R0600 Dyspnea, unspecified: Secondary | ICD-10-CM | POA: Diagnosis present

## 2013-05-22 DIAGNOSIS — R9439 Abnormal result of other cardiovascular function study: Secondary | ICD-10-CM

## 2013-05-22 DIAGNOSIS — E785 Hyperlipidemia, unspecified: Secondary | ICD-10-CM | POA: Insufficient documentation

## 2013-05-22 DIAGNOSIS — R0989 Other specified symptoms and signs involving the circulatory and respiratory systems: Secondary | ICD-10-CM | POA: Insufficient documentation

## 2013-05-22 DIAGNOSIS — I10 Essential (primary) hypertension: Secondary | ICD-10-CM

## 2013-05-22 DIAGNOSIS — R002 Palpitations: Secondary | ICD-10-CM

## 2013-05-22 DIAGNOSIS — K219 Gastro-esophageal reflux disease without esophagitis: Secondary | ICD-10-CM | POA: Insufficient documentation

## 2013-05-22 DIAGNOSIS — E1169 Type 2 diabetes mellitus with other specified complication: Secondary | ICD-10-CM

## 2013-05-22 DIAGNOSIS — Z01810 Encounter for preprocedural cardiovascular examination: Secondary | ICD-10-CM

## 2013-05-22 DIAGNOSIS — R0602 Shortness of breath: Secondary | ICD-10-CM

## 2013-05-22 DIAGNOSIS — R931 Abnormal findings on diagnostic imaging of heart and coronary circulation: Secondary | ICD-10-CM

## 2013-05-22 DIAGNOSIS — Z8249 Family history of ischemic heart disease and other diseases of the circulatory system: Secondary | ICD-10-CM | POA: Insufficient documentation

## 2013-05-22 DIAGNOSIS — E119 Type 2 diabetes mellitus without complications: Secondary | ICD-10-CM | POA: Insufficient documentation

## 2013-05-22 DIAGNOSIS — Z6841 Body Mass Index (BMI) 40.0 and over, adult: Secondary | ICD-10-CM

## 2013-05-22 HISTORY — PX: LEFT HEART CATHETERIZATION WITH CORONARY ANGIOGRAM: SHX5451

## 2013-05-22 LAB — POCT ACTIVATED CLOTTING TIME: Activated Clotting Time: 143 seconds

## 2013-05-22 SURGERY — LEFT HEART CATHETERIZATION WITH CORONARY ANGIOGRAM
Anesthesia: LOCAL

## 2013-05-22 MED ORDER — LIDOCAINE HCL (PF) 1 % IJ SOLN
INTRAMUSCULAR | Status: AC
Start: 2013-05-22 — End: 2013-05-22
  Filled 2013-05-22: qty 30

## 2013-05-22 MED ORDER — SODIUM CHLORIDE 0.9 % IJ SOLN: 3.0000 mL | Freq: Two times a day (BID) | INTRAMUSCULAR | Status: DC

## 2013-05-22 MED ORDER — DIAZEPAM 5 MG PO TABS
5.0000 mg | ORAL_TABLET | ORAL | Status: AC
  Administered 2013-05-22: 5 mg via ORAL
  Filled 2013-05-22: qty 1

## 2013-05-22 MED ORDER — HEPARIN SODIUM (PORCINE) 1000 UNIT/ML IJ SOLN
INTRAMUSCULAR | Status: AC
Start: 2013-05-22 — End: 2013-05-22
  Filled 2013-05-22: qty 1

## 2013-05-22 MED ORDER — SODIUM CHLORIDE 0.9 % IV SOLN
INTRAVENOUS | Status: DC
  Administered 2013-05-22: 08:00:00 via INTRAVENOUS

## 2013-05-22 MED ORDER — FENTANYL CITRATE 0.05 MG/ML IJ SOLN
INTRAMUSCULAR | Status: AC
  Filled 2013-05-22: qty 2

## 2013-05-22 MED ORDER — VERAPAMIL HCL 2.5 MG/ML IV SOLN
INTRAVENOUS | Status: AC
  Filled 2013-05-22: qty 2

## 2013-05-22 MED ORDER — HEPARIN (PORCINE) IN NACL 2-0.9 UNIT/ML-% IJ SOLN
INTRAMUSCULAR | Status: AC
  Filled 2013-05-22: qty 1000

## 2013-05-22 MED ORDER — MORPHINE SULFATE 2 MG/ML IJ SOLN: 2.0000 mg | INTRAMUSCULAR | Status: DC | PRN

## 2013-05-22 MED ORDER — MIDAZOLAM HCL 2 MG/2ML IJ SOLN
INTRAMUSCULAR | Status: AC
  Filled 2013-05-22: qty 2

## 2013-05-22 NOTE — Discharge Instructions (Signed)
POST RADIAL CATH INSTRUCTIONS. Radial Site Care Refer to this sheet in the next few weeks. These instructions provide you with information on caring for yourself after your procedure. Your caregiver may also give you more specific instructions. Your treatment has been planned according to current medical practices, but problems sometimes occur. Call your caregiver if you have any problems or questions after your procedure. HOME CARE INSTRUCTIONS  You may shower the day after the procedure.Remove the bandage (dressing) and gently wash the site with plain soap and water.Gently pat the site dry.  Do not apply powder or lotion to the site.  Do not submerge the affected site in water for 3 to 5 days.  Inspect the site at least twice daily.  Do not flex or bend the affected arm for 24 hours.  No lifting over 5 pounds (2.3 kg) for 5 days after your procedure.  Do not drive home if you are discharged the same day of the procedure. Have someone else drive you.  You may drive 24 hours after the procedure unless otherwise instructed by your caregiver.  Do not operate machinery or power tools for 24 hours.  A responsible adult should be with you for the first 24 hours after you arrive home. What to expect:  Any bruising will usually fade within 1 to 2 weeks.  Blood that collects in the tissue (hematoma) may be painful to the touch. It should usually decrease in size and tenderness within 1 to 2 weeks. SEEK IMMEDIATE MEDICAL CARE IF:  You have unusual pain at the radial site.  You have redness, warmth, swelling, or pain at the radial site.  You have drainage (other than a small amount of blood on the dressing).  You have chills.  You have a fever or persistent symptoms for more than 72 hours.  You have a fever and your symptoms suddenly get worse.  Your arm becomes pale, cool, tingly, or numb.  You have heavy bleeding from the site. Hold pressure on the site. Document Released:  04/03/2010 Document Revised: 05/24/2011 Document Reviewed: 04/03/2010 [facility] Patient Information 2014 [place], [place].

## 2013-05-22 NOTE — H&P (View-Only) (Signed)
 PCP: ARONSON,RICHARD A, MD  Clinic Note: Chief Complaint  Patient presents with  . Follow-up    result of myoview,no chest pain ,sob ,no edema    HPI: Diana Davidson is a 63 y.o. female with a Cardiovascular Problem List below who presents today for followup of preoperative cardiovascular exam stress test.  Interval History: Alaira previously been followed here for palpitations and history of PSVT. She has diabetes type 2 as well as hypertension and obesity. She is currently undergoing evaluation for partial sigmoid colectomy for concerning possible cancerous polyp. As I had last seen her over 6 months ago, I felt it best for her to be reevaluated preoperatively. She saw Bryan Hager, PA on February 19. Due to concern of her significant family history of CAD with her father's coronary disease as well as her underlying risk factors with history of arrhythmia, he felt it best to evaluate her with a Myoview stress test. He is now presenting for the results, which were read as INTERMEDIATE RISK way of a small reversible area in the inferior wall that could possibly bowel artifact versus true ischemia. She does note some exertional dyspnea, but denies any chest tightness or pressure. She also has some mild lower extremity edema, but denies any PND or orthopnea. No significant or worrisome palpitations - no tachycardia palpitations. No lightheadedness, dizziness, weakness or syncope/near syncope. No TIA/amaurosis fugax symptoms. No melena, hematochezia hematuria. No claudication.  Past Medical History  Diagnosis Date  . Diabetes mellitus without complication 09/2012    HgbA1c 6   . Osteopenia   . PSVT (paroxysmal supraventricular tachycardia)   . Heart palpitations     PSVT, PVCs, PACS - all short bursts, no syncope  . Hypertension   . Morbid obesity with BMI of 45.0-49.9, adult   . Arthritis   . GERD (gastroesophageal reflux disease)   . Hyperlipidemia   . Neuromuscular disorder      Prior Cardiac Evaluation and Past Surgical History: Past Surgical History  Procedure Laterality Date  . Nm pet img alzheimers    . Doppler echocardiography  03/03/2011    EF greater than 55; Moderate concentric LVH, grade 1 1 diastolic dysfunction with mildly elevated LVEDP/LAP; RV pressure 30-40 mmHg;   . Nm myoview ltd  05/15/2013    INTERMEDIATE RISK - small area of inferior reversibility suggestive of possible ischemia. Cannot exclude bowel artifact.    MEDICATIONS AND ALLERGIES REVIEWED IN EPIC No Change in Social and Family History  ROS: A comprehensive Review of Systems - Negative except Expected anxiety for upcoming surgery and now with the results of the stress test. notably no fevers chills or sweats. Mild musculoskeletal aches and pains from osteoarthritis.  PHYSICAL EXAM BP 150/90  Pulse 108  Ht 5' 2" (1.575 m)  Wt 261 lb 4.8 oz (118.525 kg)  BMI 47.78 kg/m2 Filed Weights   05/17/13 1338  Weight: 261 lb 4.8 oz (118.525 kg)   General appearance: alert, cooperative, appears stated age, no distress and morbidly obese Neck: no adenopathy, no carotid bruit and no JVD Lungs: clear to auscultation bilaterally, normal percussion bilaterally and non-labored Heart: regular rate and rhythm, S1, S2 normal, no murmur, click, rub or gallop; unable to palpate PMI Abdomen: soft, non-tender; bowel sounds normal; no masses,  no organomegaly; significant truncal obesity Extremities: extremities normal, atraumatic, no cyanosis, or edema  Pulses: 2+ and symmetric; Neurologic: Mental status: Alert, oriented, thought content appropriate Cranial nerves: normal (II-XII grossly intact)  EKG:Performed today: No    Recent Labs none available:   ASSESSMENT / PLAN: Abnormal nuclear cardiac imaging test - Intermediate Risk Unfortunately, she really doesn't have much in the way of symptoms to suggest an anginal above and beyond her exertional dyspnea. Despite this, she has a stress test that  was read as intermediate risk. I think the only way to clarify her anatomy is to proceed with cardiac catheterization.  Plan: Cardiac Catheterization via Right Radial approach with Possible PCI  I explained the procedure as well as risks, benefits, alternatives and indications to the patient and her close friend.  All questions were answered.    Risks / Complications include, but not limited to: Death, MI, CVA/TIA, VF/VT (with defibrillation), Bradycardia (need for temporary pacer placement), contrast induced nephropathy, bleeding / bruising / hematoma / pseudoaneurysm, vascular or coronary injury (with possible emergent CT or Vascular Surgery), adverse medication reactions, infection.    The patient (and friend) voice understanding and agree to proceed.   The plan will be to proceed with radial access if PCI is required, I would strongly consider using a bare-metal stent in order to not prolong her upcoming surgery and he longer to I would investigate a moderate lesion would be FFR prior to PCI if question of its physiologic significance of the lesion.   Preoperative cardiovascular examination Pending the results of her cardiac catheterization, will be able to determine her the operative risk for her intraperitoneal surgery which would be considered a high-risk surgery. If her catheterization does not show any instructed coronary disease, she should be all proceed with her catheterization without any further evaluation. She is on a beta blocker which is controlled her palpitations and will mitigate cardiac risk.   The question will be what we do if we do find any concerning lesion. If PCI is warranted, I would consider bare-metal stent placement which would delay her surgery by a minimum of 1 month.  Heart palpitations Well-controlled on Lopressor. As far as I can tell no further bursts of SVT that she consents.  Diabetes mellitus type 2 in obese Well-controlled diabetes.  Not currently on a  insulin.  PSVT (paroxysmal supraventricular tachycardia) - history of Much better control now she is back on her beta blocker. Tolerating without to much fatigue.  Hypertension Pressures a little high today. I think we'll see how she does with a catheterization and what her blood pressures are. I think she is notably anxious and stressed today.    Orders Placed This Encounter  Procedures  . CBC  . Basic Metabolic Panel (BMET)  . INR/PT  . PTT  . LEFT HEART CATHETERIZATION WITH CORONARY ANGIOGRAM   No orders of the defined types were placed in this encounter.    Followup:1 month; post-cath  DAVID W. Ellyn Hack, M.D., M.S. Interventional Cardiologist [facility]

## 2013-05-22 NOTE — CV Procedure (Signed)
CARDIAC CATHETERIZATION REPORT  NAME:  Diana Davidson   MRN: [id number] DOB:  March 12, 1951   ADMIT DATE: 05/22/2013 Procedure Date: 05/22/2013  INTERVENTIONAL CARDIOLOGIST: Leonie Man, M.D., MS PRIMARY CARE PROVIDER: Geoffery Lyons, MD PRIMARY CARDIOLOGIST: Leonie Man, MD, MS Referring Physician: Odis Hollingshead., MD  PATIENT:  Diana Davidson is a 63 y.o. morbidly obese female with diabetes mellitus, type II, hyperlipidemia, borderline hypertension, and PSVT he was seen in our clinic by Tarri Fuller, PA for preoperative risk assessment for planned partial colectomy for a cancerous polyp by Dr. Jackolyn Confer.  As part of his evaluation she was noted to have some exertional dyspnea, therefore she was referred for nuclear stress test that revealed intermediate risk findings.  In lieu of her upcoming high-risk intraperitoneal surgery, he is now referred for invasive clarification of her coronary anatomy as part of her stratification for planned surgery.  I saw her last week and discussed the procedure with risks, benefits, and alternatives and indications. She agreed to proceed.  PRE-OPERATIVE DIAGNOSIS:    Exertional Dyspnea  Intermediate Risk Myoview Stress Test  PROCEDURES PERFORMED:    Left Heart Catheterization with Coronary Angiography  PROCEDURE:Consent:  Risks of procedure as well as the alternatives and risks of each were explained to the (patient/caregiver).  Consent for procedure obtained. Consent for signed by MD and patient with RN witness -- placed on chart.   PROCEDURE: The patient was brought to the [facility] in the fasting state and prepped and draped in the usual sterile fashion for Right groin or radial access. A modified Allen's test with plethysmography was performed, revealing excellent Ulnar artery collateral flow.  Sterile technique was used including antiseptics, cap, gloves, gown, hand hygiene, mask and sheet.  Skin  prep: Chlorhexidine.  Time Out: Verified patient identification, verified procedure, site/side was marked, verified correct patient position, special equipment/implants available, medications/allergies/relevent history reviewed, required imaging and test results available.  Performed  Access: Right Radial Artery; 6 Fr Sheath -- Seldinger technique (Angiocath Micropuncture Kit)  IA Radial Cocktail, IV Heparin  Units Diagnostic Left Heart Catheterization with Coronary Angiography:  Fr TIG 4.0, JL 3.5 catheters advanced and exchanged over Long Exchange Safety J-wire into the ascending aorta and used for selective coronary artery engagement.  Right Coronary Artery Angiography: TIG 4.0  Left Coronary Artery Angiography: JL 3.5  LV Hemodynamics (LV Gram): TIG 4.0  TR Band:  1040 Hours, 13 mL air  Hemodynamics:  Central Aortic / Mean Pressures: 100/57 mmHg; 75 mmHg  Left Ventricular Pressures / EDP: 101/2 mmHg; 8 mmHg  Left Ventriculography: Not assessed  Coronary Anatomy:  Left Main: Large-caliber vessel that bifurcates into the LAD and Circumflex.  Angiographically normal LAD: Large-caliber vessel that reaches down to the apex and tapered fashion. His rise to small diagonal branches. The follow on LAD is relatively large diameter vessel all the way down to the distal vessel.  Left Circumflex: Large-caliber vessel that gives rise to very small marginal branches proximally before a very tortuous bend. It then gives off a very small AV groove circumflex followed by a bifurcation into 2 moderate caliber obtuse marginal branches that are relatively free of disease.   RCA: Large-caliber, dominant vessel that is angiographically normal. It bifurcates distally into the  Right Posterior AV Groove Branch (RPAV) and the Right Posterior Descending Artery (RPDA). Relatively normal RCA system angiographically.  RPDA: Small moderate caliber vessel that reaches roughly 2/3 the way to the apex.  RPL  Sysytem:The RPAV  moderate caliber vessel which branches into 2 small posterolateral branches.   PATIENT DISPOSITION:    The patient was transferred to the PACU holding area in a hemodynamicaly stable, chest pain free condition.  The patient tolerated the procedure well, and there were no complications.  EBL:   < 10 ml  The patient was stable before, during, and after the procedure.  POST-OPERATIVE DIAGNOSIS:    Angiographically normal coronary arteries with no obvious culprit lesion to explain the abnormal stress test.  Well-preserved LV EF by nuclear stress test. Normal LVEDP.  PLAN OF CARE:  Transfer Back to Same-Day unit for post radial cath care and anticipated discharge later today  She'll be seen back in clinic in 2 weeks for procedure evaluation to check access site.  Recommendation would be to feel free to proceed with planned surgery with a very low risk cardiac profile.   Leonie Man, M.D., M.S. [facility] [address] [place], [place]  [address]  [phone]  05/22/2013 10:40 AM

## 2013-05-22 NOTE — Interval H&P Note (Signed)
History and Physical Interval Note:  05/22/2013 6:46 AM  Diana Davidson  has presented today for surgery, with the diagnosis of Abnormal Myoview Stress Test -- Intermittent Risk. The various methods of treatment have been discussed with the patient and family. After consideration of risks, benefits and other options for treatment, the patient has consented to  Procedure(s): LEFT HEART CATHETERIZATION WITH CORONARY ANGIOGRAM (N/A) +/- PCI as a surgical intervention .  The patient's history has been reviewed, patient examined, no change in status, stable for surgery.  I have reviewed the patient's chart and labs.  Questions were answered to the patient's satisfaction.     HARDING,DAVID W  Cath Lab Visit (complete for each Cath Lab visit)  Clinical Evaluation Leading to the Procedure:   ACS: no  Non-ACS:    Anginal Classification: CCS II  Anti-ischemic medical therapy: Minimal Therapy (1 class of medications)  Non-Invasive Test Results: Intermediate-risk stress test findings: cardiac mortality 1-3%/year  Prior CABG: No previous CABG

## 2013-05-24 ENCOUNTER — Encounter (INDEPENDENT_AMBULATORY_CARE_PROVIDER_SITE_OTHER): Payer: Self-pay | Admitting: General Surgery

## 2013-05-24 NOTE — Progress Notes (Signed)
Patient ID: Diana Davidson, female   DOB: March 12, 1951, 63 y.o.   MRN: [id number] She had an abnormal cardiac stress test and underwent cardiac catheterization which demonstrated good function and no coronary artery abnormalities. She's also had the tattooing procedure done. We will go ahead and get her partial colectomy scheduled.

## 2013-05-29 ENCOUNTER — Encounter (INDEPENDENT_AMBULATORY_CARE_PROVIDER_SITE_OTHER): Payer: Self-pay

## 2013-06-21 ENCOUNTER — Encounter (HOSPITAL_COMMUNITY): Payer: Self-pay | Admitting: Pharmacy Technician

## 2013-06-25 NOTE — Patient Instructions (Addendum)
Diana Davidson  06/25/2013                           YOUR PROCEDURE IS SCHEDULED ON: 07/05/13               PLEASE REPORT TO SHORT STAY CENTER AT : 5:30 AM               CALL THIS NUMBER IF ANY PROBLEMS THE DAY OF SURGERY :               [phone]                                REMEMBER:   Do not eat food or drink liquids AFTER MIDNIGHT                  Take these medicines the morning of surgery with A SIP OF WATER:   ZYRTEC / CYMBALTA / NEXIUM / LORAZEPAM / METOPROLOL   Do not wear jewelry, make-up   Do not wear lotions, powders, or perfumes.   Do not shave legs or underarms 12 hrs. before surgery (men may shave face)  Do not bring valuables to the hospital.  Contacts, dentures or bridgework may not be worn into surgery.  Leave suitcase in the car. After surgery it may be brought to your room.  For patients admitted to the hospital more than one night, checkout time is            11:00 AM                                                       The day of discharge.   Patients discharged the day of surgery will not be allowed to drive home.            If going home same day of surgery, must have someone stay with you              FIRST 24 hrs at home and arrange for some one to drive you              home from hospital.                                                            [facility] - Preparing for Surgery Before surgery, you can play an important role.  Because skin is not sterile, your skin needs to be as free of germs as possible.  You can reduce the number of germs on your skin by washing with CHG (chlorahexidine gluconate) soap before surgery.  CHG is an antiseptic cleaner which kills germs and bonds with the skin to continue killing germs even after washing. Please DO NOT use if you have an allergy to CHG or antibacterial soaps.  If your skin becomes reddened/irritated stop using the CHG and inform your nurse when you arrive at Short Stay. Do not shave  (including legs and underarms) for at least 48 hours prior to the first CHG shower.  You may shave your face. Please follow these instructions carefully:  1.  Shower with CHG Soap the night before surgery and the  morning of Surgery.   2.  If you choose to wash your hair, wash your hair first as usual with your  normal  Shampoo.   3.  After you shampoo, rinse your hair and body thoroughly to remove the  shampoo.                                         4.  Use CHG as you would any other liquid soap.  You can apply chg directly  to the skin and wash . Gently wash with scrungie or clean wascloth    5.  Apply the CHG Soap to your body ONLY FROM THE NECK DOWN.   Do not use on open                           Wound or open sores. Avoid contact with eyes, ears mouth and genitals (private parts).                        Genitals (private parts) with your normal soap.              6.  Wash thoroughly, paying special attention to the area where your surgery  will be performed.   7.  Thoroughly rinse your body with warm water from the neck down.   8.  DO NOT shower/wash with your normal soap after using and rinsing off  the CHG Soap .                9.  Pat yourself dry with a clean towel.             10.  Wear clean pajamas.             11.  Place clean sheets on your bed the night of your first shower and do not  sleep with pets.  Day of Surgery : Do not apply any lotions/deodorants the morning of surgery.  Please wear clean clothes to the hospital/surgery center.  FAILURE TO FOLLOW THESE INSTRUCTIONS MAY RESULT IN THE CANCELLATION OF YOUR SURGERY    PATIENT SIGNATURE_________________________________

## 2013-06-26 ENCOUNTER — Encounter (INDEPENDENT_AMBULATORY_CARE_PROVIDER_SITE_OTHER): Payer: Self-pay

## 2013-06-26 ENCOUNTER — Ambulatory Visit (HOSPITAL_COMMUNITY)
Admission: RE | Admit: 2013-06-26 | Discharge: 2013-06-26 | Disposition: A | Discharge status: Home / Self Care | Payer: BC Managed Care – PPO | Source: Ambulatory Visit | Attending: General Surgery | Admitting: General Surgery

## 2013-06-26 ENCOUNTER — Encounter (HOSPITAL_COMMUNITY): Payer: Self-pay

## 2013-06-26 ENCOUNTER — Encounter (HOSPITAL_COMMUNITY)
Admission: RE | Admit: 2013-06-26 | Discharge: 2013-06-26 | Disposition: A | Discharge status: Home / Self Care | Payer: BC Managed Care – PPO | Source: Ambulatory Visit | Attending: General Surgery | Admitting: General Surgery

## 2013-06-26 DIAGNOSIS — Z01812 Encounter for preprocedural laboratory examination: Secondary | ICD-10-CM | POA: Insufficient documentation

## 2013-06-26 DIAGNOSIS — Z01818 Encounter for other preprocedural examination: Secondary | ICD-10-CM | POA: Insufficient documentation

## 2013-06-26 DIAGNOSIS — C189 Malignant neoplasm of colon, unspecified: Secondary | ICD-10-CM | POA: Insufficient documentation

## 2013-06-26 DIAGNOSIS — E119 Type 2 diabetes mellitus without complications: Secondary | ICD-10-CM | POA: Insufficient documentation

## 2013-06-26 DIAGNOSIS — I10 Essential (primary) hypertension: Secondary | ICD-10-CM | POA: Insufficient documentation

## 2013-06-26 HISTORY — DX: Malignant neoplasm of colon, unspecified: C18.9

## 2013-06-26 HISTORY — DX: Anxiety disorder, unspecified: F41.9

## 2013-06-26 HISTORY — DX: Fibromyalgia: M79.7

## 2013-06-26 HISTORY — DX: Anemia, unspecified: D64.9

## 2013-06-26 HISTORY — DX: Stress incontinence (female) (male): N39.3

## 2013-06-26 LAB — COMPREHENSIVE METABOLIC PANEL
ALBUMIN: 3.9 g/dL (ref 3.5–5.2)
ALK PHOS: 105 U/L (ref 39–117)
ALT: 19 U/L (ref 0–35)
AST: 22 U/L (ref 0–37)
BILIRUBIN TOTAL: 0.3 mg/dL (ref 0.3–1.2)
BUN: 20 mg/dL (ref 6–23)
CO2: 27 mEq/L (ref 19–32)
Calcium: 9.6 mg/dL (ref 8.4–10.5)
Chloride: 93 mEq/L — ABNORMAL LOW (ref 96–112)
Creatinine, Ser: 0.98 mg/dL (ref 0.50–1.10)
GFR calc Af Amer: 71 mL/min — ABNORMAL LOW (ref >90)
GFR calc non Af Amer: 61 mL/min — ABNORMAL LOW (ref >90)
Glucose, Bld: 148 mg/dL — ABNORMAL HIGH (ref 70–99)
POTASSIUM: 3.3 meq/L — AB (ref 3.7–5.3)
SODIUM: 136 meq/L — AB (ref 137–147)
TOTAL PROTEIN: 7.2 g/dL (ref 6.0–8.3)

## 2013-06-26 LAB — ABO/RH: ABO/RH(D): O POS

## 2013-06-26 LAB — CBC WITH DIFFERENTIAL/PLATELET
BASOS PCT: 1 % (ref 0–1)
Basophils Absolute: 0.1 10*3/uL (ref 0.0–0.1)
EOS ABS: 0.3 10*3/uL (ref 0.0–0.7)
Eosinophils Relative: 3 % (ref 0–5)
HCT: 34 % — ABNORMAL LOW (ref 36.0–46.0)
HEMOGLOBIN: 11 g/dL — AB (ref 12.0–15.0)
Lymphocytes Relative: 20 % (ref 12–46)
Lymphs Abs: 2.3 10*3/uL (ref 0.7–4.0)
MCH: 25.9 pg — ABNORMAL LOW (ref 26.0–34.0)
MCHC: 32.4 g/dL (ref 30.0–36.0)
MCV: 80.2 fL (ref 78.0–100.0)
MONOS PCT: 6 % (ref 3–12)
Monocytes Absolute: 0.7 10*3/uL (ref 0.1–1.0)
NEUTROS ABS: 8.2 10*3/uL — AB (ref 1.7–7.7)
NEUTROS PCT: 70 % (ref 43–77)
PLATELETS: 420 10*3/uL — AB (ref 150–400)
RBC: 4.24 MIL/uL (ref 3.87–5.11)
RDW: 14.5 % (ref 11.5–15.5)
WBC: 11.7 10*3/uL — ABNORMAL HIGH (ref 4.0–10.5)

## 2013-06-26 LAB — PROTIME-INR
INR: 0.92 (ref 0.00–1.49)
Prothrombin Time: 12.2 seconds (ref 11.6–15.2)

## 2013-06-26 NOTE — Progress Notes (Signed)
06/26/13 0952  OBSTRUCTIVE SLEEP APNEA  Have you ever been diagnosed with sleep apnea through a sleep study? No  Do you snore loudly (loud enough to be heard through closed doors)?  1  Do you often feel tired, fatigued, or sleepy during the daytime? 1  Has anyone observed you stop breathing during your sleep? 0  Do you have, or are you being treated for high blood pressure? 1  BMI more than 35 kg/m2? 1  Age over 62 years old? 1  Neck circumference greater than 40 cm/18 inches? 0  Gender: 0  Obstructive Sleep Apnea Score 5  Score 4 or greater  Results sent to PCP

## 2013-07-04 NOTE — Anesthesia Preprocedure Evaluation (Addendum)
Anesthesia Evaluation  Patient identified by MRN, date of birth, ID band Patient awake    Reviewed: Allergy & Precautions, H&P , NPO status , Patient's Chart, lab work & pertinent test results  Airway Mallampati: II TM Distance: >3 FB Neck ROM: Full    Dental no notable dental hx.    Pulmonary neg pulmonary ROS,  breath sounds clear to auscultation  Pulmonary exam normal       Cardiovascular hypertension, Pt. on medications and Pt. on home beta blockers Rhythm:Regular Rate:Normal     Neuro/Psych Anxiety negative neurological ROS     GI/Hepatic Neg liver ROS, GERD-  Medicated,  Endo/Other  diabetes, Type 2Morbid obesity  Renal/GU negative Renal ROS  negative genitourinary   Musculoskeletal negative musculoskeletal ROS (+)   Abdominal   Peds negative pediatric ROS (+)  Hematology  (+) anemia ,   Anesthesia Other Findings   Reproductive/Obstetrics negative OB ROS                         Anesthesia Physical Anesthesia Plan  ASA: III  Anesthesia Plan: General   Post-op Pain Management:    Induction: Intravenous  Airway Management Planned: Oral ETT  Additional Equipment:   Intra-op Plan:   Post-operative Plan: Extubation in OR  Informed Consent: I have reviewed the patients History and Physical, chart, labs and discussed the procedure including the risks, benefits and alternatives for the proposed anesthesia with the patient or authorized representative who has indicated his/her understanding and acceptance.   Dental advisory given  Plan Discussed with: CRNA and Surgeon  Anesthesia Plan Comments:         Anesthesia Quick Evaluation

## 2013-07-05 ENCOUNTER — Encounter (HOSPITAL_COMMUNITY)
Admission: RE | Disposition: A | Discharge status: Home / Self Care | Payer: Self-pay | Source: Ambulatory Visit | Attending: General Surgery

## 2013-07-05 ENCOUNTER — Encounter (HOSPITAL_COMMUNITY): Payer: Self-pay

## 2013-07-05 ENCOUNTER — Encounter (HOSPITAL_COMMUNITY): Payer: BC Managed Care – PPO | Admitting: Anesthesiology

## 2013-07-05 ENCOUNTER — Inpatient Hospital Stay (HOSPITAL_COMMUNITY)
Admission: RE | Admit: 2013-07-05 | Discharge: 2013-07-10 | DRG: 330 | Disposition: A | Discharge status: Home / Self Care | Payer: BC Managed Care – PPO | Source: Ambulatory Visit | Attending: General Surgery | Admitting: General Surgery

## 2013-07-05 ENCOUNTER — Inpatient Hospital Stay (HOSPITAL_COMMUNITY): Payer: BC Managed Care – PPO | Admitting: Anesthesiology

## 2013-07-05 DIAGNOSIS — R0600 Dyspnea, unspecified: Secondary | ICD-10-CM | POA: Diagnosis present

## 2013-07-05 DIAGNOSIS — Z791 Long term (current) use of non-steroidal anti-inflammatories (NSAID): Secondary | ICD-10-CM

## 2013-07-05 DIAGNOSIS — E119 Type 2 diabetes mellitus without complications: Secondary | ICD-10-CM | POA: Diagnosis present

## 2013-07-05 DIAGNOSIS — N393 Stress incontinence (female) (male): Secondary | ICD-10-CM | POA: Diagnosis present

## 2013-07-05 DIAGNOSIS — Z6841 Body Mass Index (BMI) 40.0 and over, adult: Secondary | ICD-10-CM

## 2013-07-05 DIAGNOSIS — R002 Palpitations: Secondary | ICD-10-CM | POA: Diagnosis present

## 2013-07-05 DIAGNOSIS — D649 Anemia, unspecified: Secondary | ICD-10-CM | POA: Diagnosis present

## 2013-07-05 DIAGNOSIS — K219 Gastro-esophageal reflux disease without esophagitis: Secondary | ICD-10-CM | POA: Diagnosis present

## 2013-07-05 DIAGNOSIS — F411 Generalized anxiety disorder: Secondary | ICD-10-CM | POA: Diagnosis present

## 2013-07-05 DIAGNOSIS — Z8249 Family history of ischemic heart disease and other diseases of the circulatory system: Secondary | ICD-10-CM

## 2013-07-05 DIAGNOSIS — Z881 Allergy status to other antibiotic agents status: Secondary | ICD-10-CM

## 2013-07-05 DIAGNOSIS — M129 Arthropathy, unspecified: Secondary | ICD-10-CM | POA: Diagnosis present

## 2013-07-05 DIAGNOSIS — R0989 Other specified symptoms and signs involving the circulatory and respiratory systems: Secondary | ICD-10-CM | POA: Diagnosis present

## 2013-07-05 DIAGNOSIS — I471 Supraventricular tachycardia, unspecified: Secondary | ICD-10-CM | POA: Diagnosis present

## 2013-07-05 DIAGNOSIS — C189 Malignant neoplasm of colon, unspecified: Secondary | ICD-10-CM | POA: Diagnosis present

## 2013-07-05 DIAGNOSIS — E1169 Type 2 diabetes mellitus with other specified complication: Secondary | ICD-10-CM | POA: Diagnosis present

## 2013-07-05 DIAGNOSIS — I4949 Other premature depolarization: Secondary | ICD-10-CM | POA: Diagnosis present

## 2013-07-05 DIAGNOSIS — E785 Hyperlipidemia, unspecified: Secondary | ICD-10-CM | POA: Diagnosis present

## 2013-07-05 DIAGNOSIS — E669 Obesity, unspecified: Secondary | ICD-10-CM

## 2013-07-05 DIAGNOSIS — M899 Disorder of bone, unspecified: Secondary | ICD-10-CM | POA: Diagnosis present

## 2013-07-05 DIAGNOSIS — Z820 Family history of epilepsy and other diseases of the nervous system: Secondary | ICD-10-CM

## 2013-07-05 DIAGNOSIS — IMO0001 Reserved for inherently not codable concepts without codable children: Secondary | ICD-10-CM | POA: Diagnosis present

## 2013-07-05 DIAGNOSIS — Z880 Allergy status to penicillin: Secondary | ICD-10-CM

## 2013-07-05 DIAGNOSIS — R0609 Other forms of dyspnea: Secondary | ICD-10-CM | POA: Diagnosis present

## 2013-07-05 DIAGNOSIS — Z888 Allergy status to other drugs, medicaments and biological substances status: Secondary | ICD-10-CM

## 2013-07-05 DIAGNOSIS — I10 Essential (primary) hypertension: Secondary | ICD-10-CM | POA: Diagnosis present

## 2013-07-05 DIAGNOSIS — Z79899 Other long term (current) drug therapy: Secondary | ICD-10-CM

## 2013-07-05 DIAGNOSIS — I519 Heart disease, unspecified: Secondary | ICD-10-CM | POA: Diagnosis present

## 2013-07-05 DIAGNOSIS — M949 Disorder of cartilage, unspecified: Secondary | ICD-10-CM

## 2013-07-05 DIAGNOSIS — C187 Malignant neoplasm of sigmoid colon: Principal | ICD-10-CM | POA: Diagnosis present

## 2013-07-05 HISTORY — PX: LAPAROSCOPIC PARTIAL COLECTOMY: SHX5907

## 2013-07-05 LAB — GLUCOSE, CAPILLARY
GLUCOSE-CAPILLARY: 206 mg/dL — AB (ref 70–99)
Glucose-Capillary: 131 mg/dL — ABNORMAL HIGH (ref 70–99)

## 2013-07-05 LAB — TYPE AND SCREEN
ABO/RH(D): O POS
ANTIBODY SCREEN: NEGATIVE

## 2013-07-05 SURGERY — LAPAROSCOPIC PARTIAL COLECTOMY
Anesthesia: General

## 2013-07-05 MED ORDER — SODIUM CHLORIDE 0.9 % IJ SOLN
INTRAMUSCULAR | Status: AC
  Filled 2013-07-05: qty 10

## 2013-07-05 MED ORDER — PROMETHAZINE HCL 25 MG/ML IJ SOLN: 6.2500 mg | INTRAMUSCULAR | Status: DC | PRN

## 2013-07-05 MED ORDER — KETOTIFEN FUMARATE 0.025 % OP SOLN
1.0000 [drp] | Freq: Two times a day (BID) | OPHTHALMIC | Status: DC | PRN
  Filled 2013-07-05: qty 5

## 2013-07-05 MED ORDER — DEXAMETHASONE SODIUM PHOSPHATE 10 MG/ML IJ SOLN
INTRAMUSCULAR | Status: DC | PRN
  Administered 2013-07-05: 10 mg via INTRAVENOUS

## 2013-07-05 MED ORDER — PROPOFOL 10 MG/ML IV BOLUS
INTRAVENOUS | Status: DC | PRN
  Administered 2013-07-05: 180 mg via INTRAVENOUS

## 2013-07-05 MED ORDER — HALOPERIDOL LACTATE 5 MG/ML IJ SOLN
1.0000 mg | Freq: Once | INTRAMUSCULAR | Status: AC
  Administered 2013-07-05: 1 mg via INTRAVENOUS
  Filled 2013-07-05: qty 0.2

## 2013-07-05 MED ORDER — ALVIMOPAN 12 MG PO CAPS
12.0000 mg | ORAL_CAPSULE | Freq: Once | ORAL | Status: AC
  Administered 2013-07-05: 12 mg via ORAL
  Filled 2013-07-05: qty 1

## 2013-07-05 MED ORDER — CISATRACURIUM BESYLATE 20 MG/10ML IV SOLN
INTRAVENOUS | Status: AC
  Filled 2013-07-05: qty 10

## 2013-07-05 MED ORDER — ALVIMOPAN 12 MG PO CAPS
12.0000 mg | ORAL_CAPSULE | Freq: Two times a day (BID) | ORAL | Status: DC
  Administered 2013-07-06 – 2013-07-07 (×3): 12 mg via ORAL
  Filled 2013-07-05 (×4): qty 1

## 2013-07-05 MED ORDER — LIDOCAINE HCL (CARDIAC) 20 MG/ML IV SOLN
INTRAVENOUS | Status: DC | PRN
  Administered 2013-07-05: 100 mg via INTRAVENOUS

## 2013-07-05 MED ORDER — HALOPERIDOL LACTATE 5 MG/ML IJ SOLN
1.0000 mg | Freq: Four times a day (QID) | INTRAMUSCULAR | Status: DC | PRN
  Filled 2013-07-05: qty 0.2

## 2013-07-05 MED ORDER — LIDOCAINE HCL (CARDIAC) 20 MG/ML IV SOLN
INTRAVENOUS | Status: AC
  Filled 2013-07-05: qty 5

## 2013-07-05 MED ORDER — MIDAZOLAM HCL 2 MG/2ML IJ SOLN
INTRAMUSCULAR | Status: AC
  Filled 2013-07-05: qty 2

## 2013-07-05 MED ORDER — HYDROMORPHONE HCL PF 1 MG/ML IJ SOLN
INTRAMUSCULAR | Status: AC
  Filled 2013-07-05: qty 2

## 2013-07-05 MED ORDER — SUFENTANIL CITRATE 50 MCG/ML IV SOLN
INTRAVENOUS | Status: DC | PRN
  Administered 2013-07-05 (×2): 10 ug via INTRAVENOUS
  Administered 2013-07-05: 20 ug via INTRAVENOUS
  Administered 2013-07-05: 10 ug via INTRAVENOUS

## 2013-07-05 MED ORDER — GLYCOPYRROLATE 0.2 MG/ML IJ SOLN
INTRAMUSCULAR | Status: AC
  Filled 2013-07-05: qty 3

## 2013-07-05 MED ORDER — DIPHENHYDRAMINE HCL 50 MG/ML IJ SOLN: 12.5000 mg | Freq: Four times a day (QID) | INTRAMUSCULAR | Status: DC | PRN

## 2013-07-05 MED ORDER — ONDANSETRON HCL 4 MG PO TABS: 4.0000 mg | ORAL_TABLET | Freq: Four times a day (QID) | ORAL | Status: DC | PRN

## 2013-07-05 MED ORDER — EPHEDRINE SULFATE 50 MG/ML IJ SOLN
INTRAMUSCULAR | Status: AC
  Filled 2013-07-05: qty 1

## 2013-07-05 MED ORDER — PROPOFOL 10 MG/ML IV BOLUS
INTRAVENOUS | Status: AC
  Filled 2013-07-05: qty 20

## 2013-07-05 MED ORDER — BIOTENE DRY MOUTH MT LIQD
15.0000 mL | Freq: Two times a day (BID) | OROMUCOSAL | Status: DC
  Administered 2013-07-05 – 2013-07-07 (×5): 15 mL via OROMUCOSAL

## 2013-07-05 MED ORDER — ONDANSETRON HCL 4 MG/2ML IJ SOLN: 4.0000 mg | Freq: Four times a day (QID) | INTRAMUSCULAR | Status: DC | PRN

## 2013-07-05 MED ORDER — HEPARIN SODIUM (PORCINE) 5000 UNIT/ML IJ SOLN
5000.0000 [IU] | Freq: Three times a day (TID) | INTRAMUSCULAR | Status: DC
  Administered 2013-07-06 – 2013-07-10 (×13): 5000 [IU] via SUBCUTANEOUS
  Filled 2013-07-05 (×16): qty 1

## 2013-07-05 MED ORDER — BIOTENE DRY MOUTH MT LIQD
15.0000 mL | Freq: Two times a day (BID) | OROMUCOSAL | Status: DC
  Administered 2013-07-05: 15 mL via OROMUCOSAL

## 2013-07-05 MED ORDER — SODIUM CHLORIDE 0.9 % IJ SOLN: 9.0000 mL | INTRAMUSCULAR | Status: DC | PRN

## 2013-07-05 MED ORDER — SUCCINYLCHOLINE CHLORIDE 20 MG/ML IJ SOLN
INTRAMUSCULAR | Status: DC | PRN
  Administered 2013-07-05: 100 mg via INTRAVENOUS

## 2013-07-05 MED ORDER — PANTOPRAZOLE SODIUM 40 MG IV SOLR
40.0000 mg | INTRAVENOUS | Status: DC
  Administered 2013-07-05 – 2013-07-06 (×2): 40 mg via INTRAVENOUS
  Filled 2013-07-05 (×3): qty 40

## 2013-07-05 MED ORDER — DEXTROSE 5 % IV SOLN
2.0000 g | INTRAVENOUS | Status: AC
  Administered 2013-07-05: 2 g via INTRAVENOUS

## 2013-07-05 MED ORDER — BUPIVACAINE HCL (PF) 0.5 % IJ SOLN
INTRAMUSCULAR | Status: AC
  Filled 2013-07-05: qty 30

## 2013-07-05 MED ORDER — ONDANSETRON HCL 4 MG/2ML IJ SOLN
INTRAMUSCULAR | Status: AC
  Filled 2013-07-05: qty 2

## 2013-07-05 MED ORDER — FENTANYL CITRATE 0.05 MG/ML IJ SOLN
INTRAMUSCULAR | Status: AC
  Administered 2013-07-05 (×4): 50 ug via INTRAVENOUS
  Filled 2013-07-05: qty 2

## 2013-07-05 MED ORDER — KETOTIFEN FUMARATE 0.025 % OP SOLN
1.0000 [drp] | OPHTHALMIC | Status: DC | PRN
  Filled 2013-07-05: qty 5

## 2013-07-05 MED ORDER — NEOSTIGMINE METHYLSULFATE 1 MG/ML IJ SOLN
INTRAMUSCULAR | Status: DC | PRN
  Administered 2013-07-05: 4 mg via INTRAVENOUS

## 2013-07-05 MED ORDER — BUPIVACAINE HCL (PF) 0.5 % IJ SOLN
INTRAMUSCULAR | Status: DC | PRN
  Administered 2013-07-05: 10 mL

## 2013-07-05 MED ORDER — CISATRACURIUM BESYLATE (PF) 10 MG/5ML IV SOLN
INTRAVENOUS | Status: DC | PRN
  Administered 2013-07-05: 1 mg via INTRAVENOUS
  Administered 2013-07-05: 10 mg via INTRAVENOUS
  Administered 2013-07-05: 4 mg via INTRAVENOUS
  Administered 2013-07-05: 2 mg via INTRAVENOUS

## 2013-07-05 MED ORDER — MORPHINE SULFATE (PF) 1 MG/ML IV SOLN
INTRAVENOUS | Status: AC
  Filled 2013-07-05: qty 25

## 2013-07-05 MED ORDER — ONDANSETRON HCL 4 MG/2ML IJ SOLN
INTRAMUSCULAR | Status: DC | PRN
  Administered 2013-07-05: 4 mg via INTRAVENOUS

## 2013-07-05 MED ORDER — LACTATED RINGERS IV SOLN
INTRAVENOUS | Status: DC | PRN
Start: 2013-07-05 — End: 2013-07-05
  Administered 2013-07-05 (×3): via INTRAVENOUS

## 2013-07-05 MED ORDER — CHLORHEXIDINE GLUCONATE 0.12 % MT SOLN
15.0000 mL | Freq: Two times a day (BID) | OROMUCOSAL | Status: DC
  Administered 2013-07-05 – 2013-07-07 (×4): 15 mL via OROMUCOSAL
  Filled 2013-07-05 (×8): qty 15

## 2013-07-05 MED ORDER — LACTATED RINGERS IV SOLN
INTRAVENOUS | Status: DC | PRN
  Administered 2013-07-05: 3000 mL via INTRAVENOUS

## 2013-07-05 MED ORDER — HYDROMORPHONE HCL PF 1 MG/ML IJ SOLN
0.2500 mg | INTRAMUSCULAR | Status: DC | PRN
  Administered 2013-07-05 (×2): 0.5 mg via INTRAVENOUS

## 2013-07-05 MED ORDER — ACETAMINOPHEN 10 MG/ML IV SOLN
1000.0000 mg | Freq: Once | INTRAVENOUS | Status: AC
  Administered 2013-07-05: 1000 mg via INTRAVENOUS
  Filled 2013-07-05: qty 100

## 2013-07-05 MED ORDER — CEFOTETAN DISODIUM 2 G IJ SOLR
2.0000 g | Freq: Two times a day (BID) | INTRAMUSCULAR | Status: AC
  Administered 2013-07-05: 2 g via INTRAVENOUS
  Filled 2013-07-05: qty 2

## 2013-07-05 MED ORDER — KCL-LACTATED RINGERS-D5W 20 MEQ/L IV SOLN
INTRAVENOUS | Status: DC
  Administered 2013-07-05 (×2): via INTRAVENOUS
  Administered 2013-07-06: 100 mL via INTRAVENOUS
  Administered 2013-07-06: 10:00:00 via INTRAVENOUS
  Filled 2013-07-05 (×12): qty 1000

## 2013-07-05 MED ORDER — DULOXETINE HCL 60 MG PO CPEP
60.0000 mg | ORAL_CAPSULE | Freq: Every morning | ORAL | Status: DC
  Administered 2013-07-05 – 2013-07-10 (×6): 60 mg via ORAL
  Filled 2013-07-05 (×6): qty 1

## 2013-07-05 MED ORDER — CEFOTETAN DISODIUM-DEXTROSE 2-2.08 GM-% IV SOLR
INTRAVENOUS | Status: AC
  Filled 2013-07-05: qty 50

## 2013-07-05 MED ORDER — FENTANYL CITRATE 0.05 MG/ML IJ SOLN
INTRAMUSCULAR | Status: AC
  Filled 2013-07-05: qty 2

## 2013-07-05 MED ORDER — DIPHENHYDRAMINE HCL 12.5 MG/5ML PO ELIX: 12.5000 mg | ORAL_SOLUTION | Freq: Four times a day (QID) | ORAL | Status: DC | PRN

## 2013-07-05 MED ORDER — SUFENTANIL CITRATE 50 MCG/ML IV SOLN
INTRAVENOUS | Status: AC
  Filled 2013-07-05: qty 1

## 2013-07-05 MED ORDER — MORPHINE SULFATE (PF) 1 MG/ML IV SOLN
INTRAVENOUS | Status: DC
  Administered 2013-07-05: 12:00:00 via INTRAVENOUS
  Administered 2013-07-05 – 2013-07-06 (×2): 1.5 mg via INTRAVENOUS
  Administered 2013-07-06: 4.5 mg via INTRAVENOUS
  Administered 2013-07-06 (×2): 3 mg via INTRAVENOUS
  Administered 2013-07-06: 1.5 mg via INTRAVENOUS
  Administered 2013-07-06: 3 mg via INTRAVENOUS
  Administered 2013-07-07: 21 mg via INTRAVENOUS
  Administered 2013-07-07: 1.5 mg via INTRAVENOUS

## 2013-07-05 MED ORDER — MIDAZOLAM HCL 5 MG/5ML IJ SOLN
INTRAMUSCULAR | Status: DC | PRN
  Administered 2013-07-05: 1 mg via INTRAVENOUS
  Administered 2013-07-05: 2 mg via INTRAVENOUS
  Administered 2013-07-05: 1 mg via INTRAVENOUS

## 2013-07-05 MED ORDER — NALOXONE HCL 0.4 MG/ML IJ SOLN: 0.4000 mg | INTRAMUSCULAR | Status: DC | PRN

## 2013-07-05 MED ORDER — DEXAMETHASONE SODIUM PHOSPHATE 10 MG/ML IJ SOLN
INTRAMUSCULAR | Status: AC
  Filled 2013-07-05: qty 1

## 2013-07-05 MED ORDER — GLYCOPYRROLATE 0.2 MG/ML IJ SOLN
INTRAMUSCULAR | Status: DC | PRN
  Administered 2013-07-05: .6 mg via INTRAVENOUS

## 2013-07-05 MED ORDER — 0.9 % SODIUM CHLORIDE (POUR BTL) OPTIME
TOPICAL | Status: DC | PRN
  Administered 2013-07-05: 5000 mL

## 2013-07-05 MED ORDER — METOPROLOL TARTRATE 25 MG PO TABS
25.0000 mg | ORAL_TABLET | Freq: Two times a day (BID) | ORAL | Status: DC
  Administered 2013-07-05 – 2013-07-10 (×10): 25 mg via ORAL
  Filled 2013-07-05 (×11): qty 1

## 2013-07-05 MED ORDER — HYDROMORPHONE HCL PF 1 MG/ML IJ SOLN
INTRAMUSCULAR | Status: DC | PRN
  Administered 2013-07-05 (×5): .4 mg via INTRAVENOUS

## 2013-07-05 MED ORDER — HYDROMORPHONE HCL PF 2 MG/ML IJ SOLN
INTRAMUSCULAR | Status: AC
  Filled 2013-07-05: qty 1

## 2013-07-05 MED ORDER — ONDANSETRON HCL 4 MG/2ML IJ SOLN: 4.0000 mg | INTRAMUSCULAR | Status: DC | PRN

## 2013-07-05 SURGICAL SUPPLY — 85 items
APPLIER CLIP 5 13 M/L LIGAMAX5 (MISCELLANEOUS)
APPLIER CLIP ROT 10 11.4 M/L (STAPLE)
APR CLP MED LRG 11.4X10 (STAPLE)
APR CLP MED LRG 5 ANG JAW (MISCELLANEOUS)
BLADE EXTENDED COATED 6.5IN (ELECTRODE) ×1 IMPLANT
BLADE HEX COATED 2.75 (ELECTRODE) ×4 IMPLANT
BLADE SURG SZ10 CARB STEEL (BLADE) ×2 IMPLANT
CABLE HIGH FREQUENCY MONO STRZ (ELECTRODE) ×1 IMPLANT
CANISTER SUCTION 2500CC (MISCELLANEOUS) ×1 IMPLANT
CELLS DAT CNTRL 66122 CELL SVR (MISCELLANEOUS) IMPLANT
CLIP APPLIE 5 13 M/L LIGAMAX5 (MISCELLANEOUS) IMPLANT
CLIP APPLIE ROT 10 11.4 M/L (STAPLE) IMPLANT
COUNTER NEEDLE 20 DBL MAG RED (NEEDLE) ×1 IMPLANT
COVER MAYO STAND STRL (DRAPES) ×3 IMPLANT
DECANTER SPIKE VIAL GLASS SM (MISCELLANEOUS) ×1 IMPLANT
DISSECTOR BLUNT TIP ENDO 5MM (MISCELLANEOUS) IMPLANT
DRAIN CHANNEL 19F RND (DRAIN) IMPLANT
DRAPE LAPAROSCOPIC ABDOMINAL (DRAPES) ×2 IMPLANT
DRAPE LG THREE QUARTER DISP (DRAPES) ×3 IMPLANT
DRAPE UTILITY XL STRL (DRAPES) ×4 IMPLANT
DRAPE WARM FLUID 44X44 (DRAPE) ×2 IMPLANT
DRSG OPSITE POSTOP 4X10 (GAUZE/BANDAGES/DRESSINGS) IMPLANT
DRSG OPSITE POSTOP 4X6 (GAUZE/BANDAGES/DRESSINGS) IMPLANT
DRSG OPSITE POSTOP 4X8 (GAUZE/BANDAGES/DRESSINGS) ×1 IMPLANT
ELECT REM PT RETURN 9FT ADLT (ELECTROSURGICAL) ×2
ELECTRODE REM PT RTRN 9FT ADLT (ELECTROSURGICAL) ×1 IMPLANT
EVACUATOR SILICONE 100CC (DRAIN) IMPLANT
FILTER SMOKE EVAC LAPAROSHD (FILTER) ×1 IMPLANT
GLOVE ECLIPSE 8.0 STRL XLNG CF (GLOVE) ×6 IMPLANT
GLOVE INDICATOR 8.0 STRL GRN (GLOVE) ×6 IMPLANT
GOWN STRL REUS W/TWL XL LVL3 (GOWN DISPOSABLE) ×14 IMPLANT
KIT BASIN OR (CUSTOM PROCEDURE TRAY) ×3 IMPLANT
LEGGING LITHOTOMY PAIR STRL (DRAPES) ×2 IMPLANT
LIGASURE IMPACT 36 18CM CVD LR (INSTRUMENTS) IMPLANT
MANIFOLD NEPTUNE II (INSTRUMENTS) ×1 IMPLANT
PACK GENERAL/GYN (CUSTOM PROCEDURE TRAY) ×1 IMPLANT
PENCIL BUTTON HOLSTER BLD 10FT (ELECTRODE) ×3 IMPLANT
RELOAD PROXIMATE 75MM BLUE (ENDOMECHANICALS) ×4 IMPLANT
RELOAD STAPLE 75 3.8 BLU REG (ENDOMECHANICALS) IMPLANT
RETRACTOR WND ALEXIS 18 MED (MISCELLANEOUS) IMPLANT
RTRCTR WOUND ALEXIS 18CM MED (MISCELLANEOUS)
SCALPEL HARMONIC ACE (MISCELLANEOUS) ×1 IMPLANT
SCISSORS LAP 5X35 DISP (ENDOMECHANICALS) ×2 IMPLANT
SCISSORS LAP 5X45 EPIX DISP (ENDOMECHANICALS) ×1 IMPLANT
SET IRRIG TUBING LAPAROSCOPIC (IRRIGATION / IRRIGATOR) IMPLANT
SLEEVE XCEL OPT CAN 5 100 (ENDOMECHANICALS) ×6 IMPLANT
SOLUTION ANTI FOG 6CC (MISCELLANEOUS) ×2 IMPLANT
SPONGE DRAIN TRACH 4X4 STRL 2S (GAUZE/BANDAGES/DRESSINGS) ×1 IMPLANT
SPONGE GAUZE 4X4 12PLY (GAUZE/BANDAGES/DRESSINGS) ×1 IMPLANT
SPONGE LAP 18X18 X RAY DECT (DISPOSABLE) ×4 IMPLANT
STAPLER CIRC CVD 29MM 37CM (STAPLE) ×1 IMPLANT
STAPLER CUT CVD 40MM BLUE (STAPLE) ×1 IMPLANT
STAPLER PROXIMATE 75MM BLUE (STAPLE) ×1 IMPLANT
STAPLER VISISTAT 35W (STAPLE) ×2 IMPLANT
SUCTION POOLE TIP (SUCTIONS) ×3 IMPLANT
SUT ETHILON 2 0 PS N (SUTURE) IMPLANT
SUT ETHILON 3 0 PS 1 (SUTURE) ×1 IMPLANT
SUT MNCRL AB 4-0 PS2 18 (SUTURE) ×1 IMPLANT
SUT PDS AB 1 CTX 36 (SUTURE) IMPLANT
SUT PDS AB 1 TP1 96 (SUTURE) ×2 IMPLANT
SUT PROLENE 2 0 KS (SUTURE) IMPLANT
SUT PROLENE 2 0 SH DA (SUTURE) ×1 IMPLANT
SUT SILK 2 0 (SUTURE) ×2
SUT SILK 2 0 SH CR/8 (SUTURE) ×2 IMPLANT
SUT SILK 2-0 18XBRD TIE 12 (SUTURE) ×1 IMPLANT
SUT SILK 3 0 (SUTURE) ×2
SUT SILK 3 0 SH CR/8 (SUTURE) ×2 IMPLANT
SUT SILK 3-0 18XBRD TIE 12 (SUTURE) ×1 IMPLANT
SUT VIC AB 2-0 CT1 27 (SUTURE) ×2
SUT VIC AB 2-0 CT1 TAPERPNT 27 (SUTURE) IMPLANT
SUT VICRYL 2 0 18  UND BR (SUTURE)
SUT VICRYL 2 0 18 UND BR (SUTURE) ×1 IMPLANT
SYR BULB IRRIGATION 50ML (SYRINGE) ×1 IMPLANT
SYS LAPSCP GELPORT 120MM (MISCELLANEOUS)
SYSTEM LAPSCP GELPORT 120MM (MISCELLANEOUS) IMPLANT
TOWEL OR 17X26 10 PK STRL BLUE (TOWEL DISPOSABLE) ×4 IMPLANT
TOWEL OR NON WOVEN STRL DISP B (DISPOSABLE) ×4 IMPLANT
TRAY FOLEY CATH 14FRSI W/METER (CATHETERS) ×2 IMPLANT
TRAY LAP CHOLE (CUSTOM PROCEDURE TRAY) ×2 IMPLANT
TROCAR BLADELESS OPT 5 100 (ENDOMECHANICALS) ×2 IMPLANT
TROCAR BLADELESS OPT 5 150 (ENDOMECHANICALS) ×2 IMPLANT
TROCAR XCEL BLUNT TIP 100MML (ENDOMECHANICALS) ×1 IMPLANT
TROCAR XCEL NON-BLD 11X100MML (ENDOMECHANICALS) IMPLANT
TUBING INSUFFLATION 10FT LAP (TUBING) ×3 IMPLANT
YANKAUER SUCT BULB TIP 10FT TU (MISCELLANEOUS) ×3 IMPLANT

## 2013-07-05 NOTE — Transfer of Care (Signed)
Immediate Anesthesia Transfer of Care Note  Patient: Diana Davidson  Procedure(s) Performed: Procedure(s): LAPAROSCOPIC ASSISTED SIGMOID COLECTOMY AND PROCTOSCOPY (N/A)  Patient Location: PACU  Anesthesia Type:General  Level of Consciousness: Patient restless, confused, thrashing in bed. Given fentanyl and versed per chart and DR. Rose informed. Pt now resting comfortably in bed, lying quietly, ventilating well.   Airway & Oxygen Therapy: Patient spontaneously breathing, ventilating well, oxygen via simple oxygen mask.  Post-op Assessment: Report given to PACU RN, vital signs reviewed and stable, moving all extremities.   Post vital signs: Reviewed and stable.  Complications: No apparent anesthesia complications

## 2013-07-05 NOTE — Progress Notes (Signed)
Pt received form OR:  Disoriented, agitated and grabbing people, difficult to keep pt safely in bed.  Pt medicated by CRNA with minimal resolution of symptoms.  Dr Kalman Shan called to bedside, orders received.  Pt remained unsafe to self and nurses , Dr Kalman Shan notified and notified him pt HR in 140's- placed in soft restraints for emergence.  Medicated with .5mg  hydromorphone and Haldol.(Dr Kalman Shan ordered restraints and HALDOL).  Pt became somulent and resting comfortably at present.  Dr Kalman Shan reassessed pt at bedside, HR 90's  BP 123/57.

## 2013-07-05 NOTE — Op Note (Addendum)
Operative Note  Diana Davidson female 63 y.o. 07/05/2013  PREOPERATIVE DX:  Sigmoid colon cancer  POSTOPERATIVE DX:  Same  PROCEDURE:  Laparoscopic assisted sigmoid colectomy and mobilization of the splenic flexure         Surgeon: Odis Hollingshead   Assistants: Georganna Skeans M.D.  Anesthesia: General endotracheal anesthesia  Indications:  This is a 63 year old female noted to have some mild anemia and Hemoccult positive stools. Colonoscopy demonstrated a polypoid mass in the sigmoid colon that was biopsied and positive for invasive adenocarcinoma. She now presents for the above elective procedure.    Procedure Detail:  She was seen in the holding area. She was brought to the operating room placed supine on the operating table and a general anesthetic was given. She was placed in the lithotomy position. A Foley catheter and oral gastric tube were inserted. The abdominal wall and perineal areas were widely sterilely prepped and draped.  She was placed in slight reverse Trendelenburg position. A 5 mm incision was made in the left subcostal area. Using a 5 mm Optiview trocar and laparoscope access was gained into the peritoneal cavity. A pneumoperitoneum was created. Inspection of the area underneath the trocar demonstrated no evidence of bleeding or organ injury.  A 5 mm trocar was placed in the supraumbilical region. A 5 mm trocar was placed in the right lower quadrant. A 5 mm trocar was placed in the lower midline. Eventually, a 5 mm trocar was placed in the left lower quadrant.  The sigmoid colon was identified and a tattoo mark was identified where the cancer was. Using sharp dissection I mobilized the sigmoid and left colon by dividing the lateral attachments. I then mobilized the splenic flexure by dissecting the omentum free from the distal transverse colon and using sharp and blunt dissection to mobilize the splenic flexure allowing it to drop down toward the pelvis. I then  directed my attention to the distal sigmoid colon. The left ureter was identified. The plane of dissection was kept anterior and medial to this.  I mobilize the distal sigmoid colon with some sharp and blunt dissection.   Once I felt that the mobilization was adequate, the lower midline trocar was removed. A limited lower midline incision was made through the skin, subcutaneous tissue, fascia, peritoneum extraction site. I was able to bring in the sigmoid colon up into the wound. Identified the rectosigmoid junction. Using the linear cutting stapler I divided the colon just distal to the rectal sigmoid junction. I then divided the colon at the descending colon sigmoid colon junction using the linear cutting stapler. I then did a wedge resection the mesentery using the LigaSure. The distal end of the specimen was marked and was passed off the field.  I inspected the area and hemostasis was adequate. I then removed the staple line from the descending colon and placed a size 29 EEA anvil in it. I then closed the colon with the linear cutting stapler. I brought the anvil out the antimesenteric side of the colon and then secured it with a 2-0 Prolene pursestring suture. The handle of the stapler was then inserted through the anus. I then performed an end rectum to side descending colon stapled anastomosis with the #29 EEA. 2 complete donuts were noted. The proximal and distal donuts were sent to pathology. I then occluded the descending colon just proximal to the anastomosis. The pelvis was filled with water. Air was insufflated into the rectum and there is no evidence of  anastomotic leak.  The pelvis was then irrigated. The 5 mm left lower quadrant trocar was removed and a 19 Blake drain placed through this incision and into the pelvis. It was anchored to the skin with a 3-0 nylon suture. The right lower quadrant trocar was removed.  Sponge count was reported to be correct. I then closed the peritoneum with a  running 2-0 Vicryl suture. The fascia was closed with a running double loop #1 PDS suture. Repeat laparoscopy was performed. Four-quadrant inspection was done. There is no evidence of organ injury or bleeding. The fascial closure was solid. The pneumoperitoneum was released and the remaining trochars removed.  The subcutaneous tissue of the extraction site incision was irrigated. The skin was closed with staples. The trocar site incisions were closed with 4-0 Monocryl subcuticular stitches by Steri-Strips. Sterile dressings were applied to all areas. The drain was hooked to bulb suction.  She tolerated the procedure without any apparent complications and was taken to the recovery in satisfactory condition.   Estimated Blood Loss:  300 mL         Drains: #19 round Blake drain  Blood Given: none          Specimens: Sigmoid colon. Proximal doughnut. Distal donut.        Complications:  * No complications entered in OR log *         Disposition: PACU - hemodynamically stable.         Condition: stable

## 2013-07-05 NOTE — H&P (Signed)
Diana Davidson is an 63 y.o. female.   Chief Complaint:  Here for partial colectomy HPI:   She was noted to have a very mild anemia and Hemoccult positive stools. Upper and lower endoscopy were performed. 35 cm from the anal verge was a 12 mm polyp that was removed. Adenocarcinoma of was noted in the polyp. Could not tell if margins were clear. There was submucosal invasion. No first or second-degree relative history of colon cancer.  She has undergone cardiac clearance and the cancer site has been tattooed.   Past Medical History  Diagnosis Date  . Osteopenia   . PSVT (paroxysmal supraventricular tachycardia)   . Heart palpitations     PSVT, PVCs, PACS - all short bursts, no syncope  . Hypertension   . Morbid obesity with BMI of 45.0-49.9, adult   . Arthritis   . GERD (gastroesophageal reflux disease)   . Hyperlipidemia   . Neuromuscular disorder   . Fibromyalgia   . Colon cancer   . Stress incontinence   . Anemia   . Diabetes mellitus without complication 07/7844    [id number] 6  - Borderline diabetes  . Anxiety     hx panic attacks - none for several years    Past Surgical History  Procedure Laterality Date  . Nm pet img alzheimers    . Doppler echocardiography  03/03/2011    EF greater than 55; Moderate concentric LVH, grade 1 1 diastolic dysfunction with mildly elevated LVEDP/LAP; RV pressure 30-40 mmHg;   . Nm myoview ltd  05/15/2013    INTERMEDIATE RISK - small area of inferior reversibility suggestive of possible ischemia. Cannot exclude bowel artifact.  . Shoulder surgery    . Bunionectomy      LEFT    Family History  Problem Relation Age of Onset  . Diabetes Mother   . Hypertension Mother   . Alzheimer's disease Mother   . Cancer Paternal Aunt     ovarian   Social History:  reports that she has never smoked. She does not have any smokeless tobacco history on file. She reports that she does not drink alcohol or use illicit drugs.  Allergies:  Allergies  Allergen  Reactions  . Novocain [Procaine]     Heart palpitations   . Prednisone Other (See Comments)    Red flush area across her face  . Sulfa Antibiotics Other (See Comments)    Sore in top of mouth  . Penicillins Rash    Medications Prior to Admission  Medication Sig Dispense Refill  . Calcium Citrate-Vitamin D (CALCIUM CITRATE + D3 PO) Take 1 tablet by mouth 2 (two) times daily.       . cetirizine (ZYRTEC) 10 MG tablet Take 10 mg by mouth daily.      . Cholecalciferol (VITAMIN D-3) 1000 UNITS CAPS Take 2,000 Units by mouth daily.       . Coenzyme Q10 (CO Q 10) 100 MG CAPS Take 100 mg by mouth daily.       . DULoxetine (CYMBALTA) 60 MG capsule Take 60 mg by mouth every morning.       Marland Kitchen esomeprazole (NEXIUM) 40 MG capsule Take 40 mg by mouth daily before breakfast.      . ibuprofen (ADVIL,MOTRIN) 200 MG tablet Take 400 mg by mouth every 6 (six) hours as needed for moderate pain.      Marland Kitchen ketotifen (ZADITOR) 0.025 % ophthalmic solution Place 1 drop into both eyes as needed (Dry eyes).      Marland Kitchen  LORazepam (ATIVAN) 1 MG tablet Take 1 mg by mouth 3 (three) times daily.       Marland Kitchen losartan-hydrochlorothiazide (HYZAAR) 50-12.5 MG per tablet Take 1 tablet by mouth 2 (two) times daily.       . metoprolol tartrate (LOPRESSOR) 25 MG tablet Take 1 tablet (25 mg total) by mouth 2 (two) times daily.  60 tablet  12  . Omega-3 Krill Oil 500 MG CAPS Take by mouth.      Marland Kitchen oxymetazoline (VICKS SINEX 12 HOUR) 0.05 % nasal spray Place 1 spray into both nostrils daily as needed for congestion.      . simvastatin (ZOCOR) 20 MG tablet Take 20 mg by mouth every evening.        Results for orders placed during the hospital encounter of 07/05/13 (from the past 48 hour(s))  GLUCOSE, CAPILLARY     Status: Abnormal   Collection Time    07/05/13  6:04 AM      Result Value Ref Range   Glucose-Capillary 131 (*) 70 - 99 mg/dL   No results found.  Review of Systems  Constitutional: Negative for fever and chills.   Gastrointestinal: Negative for nausea, vomiting and diarrhea.    Blood pressure 133/80, pulse 71, temperature 98.5 F (36.9 C), temperature source Oral, resp. rate 18, SpO2 100.00%. Physical Exam  Constitutional: No distress.  Obese.  HENT:  Head: Normocephalic and atraumatic.  Eyes: No scleral icterus.  Cardiovascular: Normal rate and regular rhythm.   Respiratory: Effort normal and breath sounds normal.  GI: Soft. She exhibits no mass. There is no tenderness.  Obese  Musculoskeletal: She exhibits no edema.  Neurological: She is alert.  Skin: Skin is warm and dry.  Psychiatric: She has a normal mood and affect. Her behavior is normal.     Assessment/Plan Sigmoid colon  Cancer  Plan:  Laparoscopic assisted partial colectomy.  Rhunette Croft Darrin Koman 07/05/2013, 7:09 AM

## 2013-07-05 NOTE — Anesthesia Postprocedure Evaluation (Signed)
  Anesthesia Post-op Note  Patient: Diana Davidson  Procedure(s) Performed: Procedure(s) (LRB): LAPAROSCOPIC ASSISTED SIGMOID COLECTOMY AND PROCTOSCOPY (N/A)  Patient Location: PACU  Anesthesia Type: General  Level of Consciousness: awake and alert   Airway and Oxygen Therapy: Patient Spontanous Breathing  Post-op Pain: mild  Post-op Assessment: Post-op Vital signs reviewed, Patient's Cardiovascular Status Stable, Respiratory Function Stable, Patent Airway and No signs of Nausea or vomiting  Patient delirious in PACU, no problems with gas exchange, haldol given for sedation, patient uses benzo's chronically  Last Vitals:  Filed Vitals:   07/05/13 1145  BP:   Pulse:   Temp: 37.1 C  Resp:     Post-op Vital Signs: stable   Complications: No apparent anesthesia complications

## 2013-07-05 NOTE — Anesthesia Procedure Notes (Signed)
Procedure Name: Intubation Date/Time: 07/05/2013 7:40 AM Performed by: Danley Danker L Patient Re-evaluated:Patient Re-evaluated prior to inductionOxygen Delivery Method: Circle system utilized Preoxygenation: Pre-oxygenation with 100% oxygen Intubation Type: IV induction Ventilation: Mask ventilation without difficulty and Oral airway inserted - appropriate to patient size Laryngoscope Size: Sabra Heck and 2 Grade View: Grade I Tube type: Oral Tube size: 7.5 mm Number of attempts: 1 Airway Equipment and Method: Stylet Placement Confirmation: ETT inserted through vocal cords under direct vision,  breath sounds checked- equal and bilateral and positive ETCO2 Secured at: 21 cm Tube secured with: Tape Dental Injury: Teeth and Oropharynx as per pre-operative assessment

## 2013-07-06 ENCOUNTER — Encounter (HOSPITAL_COMMUNITY): Payer: Self-pay | Admitting: General Surgery

## 2013-07-06 DIAGNOSIS — E119 Type 2 diabetes mellitus without complications: Secondary | ICD-10-CM

## 2013-07-06 LAB — CBC
HCT: 30.4 % — ABNORMAL LOW (ref 36.0–46.0)
Hemoglobin: 10 g/dL — ABNORMAL LOW (ref 12.0–15.0)
MCH: 26.1 pg (ref 26.0–34.0)
MCHC: 32.9 g/dL (ref 30.0–36.0)
MCV: 79.4 fL (ref 78.0–100.0)
PLATELETS: 344 10*3/uL (ref 150–400)
RBC: 3.83 MIL/uL — AB (ref 3.87–5.11)
RDW: 14.1 % (ref 11.5–15.5)
WBC: 14.4 10*3/uL — ABNORMAL HIGH (ref 4.0–10.5)

## 2013-07-06 LAB — BASIC METABOLIC PANEL
BUN: 11 mg/dL (ref 6–23)
CO2: 26 meq/L (ref 19–32)
CREATININE: 0.71 mg/dL (ref 0.50–1.10)
Calcium: 9 mg/dL (ref 8.4–10.5)
Chloride: 96 mEq/L (ref 96–112)
GFR calc Af Amer: >90 mL/min (ref >90)
Glucose, Bld: 205 mg/dL — ABNORMAL HIGH (ref 70–99)
Potassium: 3.6 mEq/L — ABNORMAL LOW (ref 3.7–5.3)
SODIUM: 135 meq/L — AB (ref 137–147)

## 2013-07-06 LAB — GLUCOSE, CAPILLARY: GLUCOSE-CAPILLARY: 131 mg/dL — AB (ref 70–99)

## 2013-07-06 MED ORDER — INSULIN ASPART 100 UNIT/ML ~~LOC~~ SOLN
0.0000 [IU] | Freq: Three times a day (TID) | SUBCUTANEOUS | Status: DC
  Administered 2013-07-06: 2 [IU] via SUBCUTANEOUS
  Administered 2013-07-06: 5 [IU] via SUBCUTANEOUS
  Administered 2013-07-07 – 2013-07-10 (×3): 2 [IU] via SUBCUTANEOUS

## 2013-07-06 NOTE — Care Management Note (Signed)
    Page 1 of 1   07/06/2013     10:26:29 AM CARE MANAGEMENT NOTE 07/06/2013  Patient:  Diana Davidson, Diana Davidson   Account Number:  [id number]  Date Initiated:  07/06/2013  Documentation initiated by:  Sunday Spillers  Subjective/Objective Assessment:   63 yo female admitted s/p colectomy. PTA lived at home alone.     Action/Plan:   Home when stable   Anticipated DC Date:  07/09/2013   Anticipated DC Plan:  [place]  CM consult      Choice offered to / List presented to:             Status of service:  Completed, signed off Medicare Important Message given?   (If response is "NO", the following Medicare IM given date fields will be blank) Date Medicare IM given:   Date Additional Medicare IM given:    Discharge Disposition:  HOME/SELF CARE  Per UR Regulation:  Reviewed for med. necessity/level of care/duration of stay  If discussed at [place] of Stay Meetings, dates discussed:    Comments:

## 2013-07-06 NOTE — Progress Notes (Signed)
1 Day Post-Op  Subjective: Adequate pain control.  No nausea.  Has been OOB.  Objective: Vital signs in last 24 hours: Temp:  [97.7 F (36.5 C)-98.8 F (37.1 C)] 97.7 F (36.5 C) (04/24 0537) Pulse Rate:  [87-139] 90 (04/24 0537) Resp:  [13-29] 16 (04/24 0749) BP: (96-154)/(39-84) 112/68 mmHg (04/24 0537) SpO2:  [92 %-100 %] 98 % (04/24 0749) Weight:  [251 lb (113.853 kg)] 251 lb (113.853 kg) (04/23 1415) Last BM Date: 07/04/13  Intake/Output from previous day: 04/23 0701 - 04/24 0700 In: 4518.8 [I.V.:4468.8; IV Piggyback:50] Out: 1225 [Urine:825; Drains:225; Blood:150] Intake/Output this shift: Total I/O In: 0  Out: 250 [Urine:250]  PE: General- In NAD Abdomen-soft, dressings dry, hypoactive bowel sounds, thin serosanguinous drain output  Lab Results:   Recent Labs  07/06/13 0410  WBC 14.4*  HGB 10.0*  HCT 30.4*  PLT 344   BMET  Recent Labs  07/06/13 0410  NA 135*  K 3.6*  CL 96  CO2 26  GLUCOSE 205*  BUN 11  CREATININE 0.71  CALCIUM 9.0   PT/INR No results found for this basename: LABPROT, INR,  in the last 72 hours Comprehensive Metabolic Panel:    Component Value Date/Time   NA 135* 07/06/2013 0410   NA 136* 06/26/2013 0910   K 3.6* 07/06/2013 0410   K 3.3* 06/26/2013 0910   CL 96 07/06/2013 0410   CL 93* 06/26/2013 0910   CO2 26 07/06/2013 0410   CO2 27 06/26/2013 0910   BUN 11 07/06/2013 0410   BUN 20 06/26/2013 0910   CREATININE 0.71 07/06/2013 0410   CREATININE 0.98 06/26/2013 0910   CREATININE 0.91 05/17/2013 1439   GLUCOSE 205* 07/06/2013 0410   GLUCOSE 148* 06/26/2013 0910   CALCIUM 9.0 07/06/2013 0410   CALCIUM 9.6 06/26/2013 0910   AST 22 06/26/2013 0910   ALT 19 06/26/2013 0910   ALKPHOS 105 06/26/2013 0910   BILITOT 0.3 06/26/2013 0910   PROT 7.2 06/26/2013 0910   ALBUMIN 3.9 06/26/2013 0910     Studies/Results: No results found.  Anti-infectives: Anti-infectives   Start     Dose/Rate Route Frequency Ordered Stop   07/05/13 1330   cefoTEtan (CEFOTAN) 2 g in dextrose 5 % 50 mL IVPB     2 g 100 mL/hr over 30 Minutes Intravenous Every 12 hours 07/05/13 1321 07/05/13 1536   07/05/13 0531  cefoTEtan (CEFOTAN) 2 g in dextrose 5 % 50 mL IVPB     2 g 100 mL/hr over 30 Minutes Intravenous On call to O.R. 07/05/13 0531 07/05/13 0745      Assessment Principal Problem:   Colon cancer s/p lap assisted sigmoid colectomy 07/05/13-stable overnight    Active Problems:   Acute on chronic anemia   Diabetes mellitus type 2 in obese-glucose high this AM   Morbid obesity with BMI of 45.0-49.9, adult   Hypertension   LOS: 1 day   Plan: SSI, clear liquids    Diana Davidson 07/06/2013

## 2013-07-07 LAB — GLUCOSE, CAPILLARY
GLUCOSE-CAPILLARY: 97 mg/dL (ref 70–99)
Glucose-Capillary: 145 mg/dL — ABNORMAL HIGH (ref 70–99)
Glucose-Capillary: 125 mg/dL — ABNORMAL HIGH (ref 70–99)
Glucose-Capillary: 85 mg/dL (ref 70–99)
Glucose-Capillary: 93 mg/dL (ref 70–99)

## 2013-07-07 MED ORDER — DIPHENHYDRAMINE HCL 50 MG/ML IJ SOLN: 12.5000 mg | Freq: Four times a day (QID) | INTRAMUSCULAR | Status: DC | PRN

## 2013-07-07 MED ORDER — LORAZEPAM 0.5 MG PO TABS: 0.5000 mg | ORAL_TABLET | Freq: Three times a day (TID) | ORAL | Status: DC | PRN

## 2013-07-07 MED ORDER — OXYCODONE HCL 5 MG PO TABS
5.0000 mg | ORAL_TABLET | ORAL | Status: DC | PRN
  Administered 2013-07-07: 5 mg via ORAL
  Administered 2013-07-07 – 2013-07-09 (×7): 10 mg via ORAL
  Filled 2013-07-07 (×9): qty 2

## 2013-07-07 MED ORDER — OXYCODONE HCL 5 MG PO TABS
5.0000 mg | ORAL_TABLET | ORAL | Status: DC | PRN
Start: 2013-07-07 — End: 2013-07-07
  Administered 2013-07-07 (×2): 5 mg via ORAL
  Filled 2013-07-07 (×2): qty 1

## 2013-07-07 MED ORDER — SODIUM CHLORIDE 0.9 % IJ SOLN: 3.0000 mL | INTRAMUSCULAR | Status: DC | PRN

## 2013-07-07 MED ORDER — LORATADINE 10 MG PO TABS
10.0000 mg | ORAL_TABLET | Freq: Every day | ORAL | Status: DC
  Administered 2013-07-07 – 2013-07-10 (×4): 10 mg via ORAL
  Filled 2013-07-07 (×4): qty 1

## 2013-07-07 MED ORDER — LIP MEDEX EX OINT
1.0000 "application " | TOPICAL_OINTMENT | Freq: Two times a day (BID) | CUTANEOUS | Status: DC
  Administered 2013-07-07 – 2013-07-09 (×4): 1 via TOPICAL
  Filled 2013-07-07: qty 7

## 2013-07-07 MED ORDER — ALUM & MAG HYDROXIDE-SIMETH 200-200-20 MG/5ML PO SUSP: 30.0000 mL | Freq: Four times a day (QID) | ORAL | Status: DC | PRN

## 2013-07-07 MED ORDER — LACTATED RINGERS IV BOLUS (SEPSIS): 1000.0000 mL | Freq: Three times a day (TID) | INTRAVENOUS | Status: AC | PRN

## 2013-07-07 MED ORDER — SACCHAROMYCES BOULARDII 250 MG PO CAPS
250.0000 mg | ORAL_CAPSULE | Freq: Two times a day (BID) | ORAL | Status: DC
  Administered 2013-07-07 – 2013-07-09 (×6): 250 mg via ORAL
  Filled 2013-07-07 (×8): qty 1

## 2013-07-07 MED ORDER — OXYCODONE HCL 5 MG PO TABS
5.0000 mg | ORAL_TABLET | ORAL | Status: DC | PRN
Start: 2013-07-07 — End: 2013-07-07

## 2013-07-07 MED ORDER — OXYMETAZOLINE HCL 0.05 % NA SOLN
1.0000 | Freq: Every day | NASAL | Status: DC | PRN
  Administered 2013-07-07: 1 via NASAL
  Filled 2013-07-07: qty 15

## 2013-07-07 MED ORDER — ACETAMINOPHEN 325 MG PO TABS: 325.0000 mg | ORAL_TABLET | Freq: Four times a day (QID) | ORAL | Status: DC | PRN

## 2013-07-07 MED ORDER — PANTOPRAZOLE SODIUM 40 MG PO TBEC
40.0000 mg | DELAYED_RELEASE_TABLET | Freq: Every day | ORAL | Status: DC
  Administered 2013-07-07 – 2013-07-10 (×4): 40 mg via ORAL
  Filled 2013-07-07 (×4): qty 1

## 2013-07-07 MED ORDER — METOPROLOL TARTRATE 12.5 MG HALF TABLET
12.5000 mg | ORAL_TABLET | Freq: Two times a day (BID) | ORAL | Status: DC | PRN
  Filled 2013-07-07: qty 1

## 2013-07-07 MED ORDER — MAGIC MOUTHWASH
15.0000 mL | Freq: Four times a day (QID) | ORAL | Status: DC | PRN
  Filled 2013-07-07: qty 15

## 2013-07-07 MED ORDER — DIPHENHYDRAMINE HCL 25 MG PO CAPS: 25.0000 mg | ORAL_CAPSULE | Freq: Four times a day (QID) | ORAL | Status: DC | PRN

## 2013-07-07 NOTE — Progress Notes (Signed)
Jensen, MD, [address] [place], [place] [address] Phone: [phone] FAX: Diana Davidson [phone] 07/17/1950  CARE TEAM:  PCP: Diana Lyons, MD  Outpatient Care Team: Patient Care Team: Diana Lyons, MD as PCP - General (Internal Medicine)  Inpatient Treatment Team: Treatment Team: Attending Provider: Odis Hollingshead, MD; Registered Nurse: Diana Masson, RN; Registered Nurse: Diana Reeve, RN; Registered Nurse: Diana Oppenheim, RN   Subjective:  IV fell out - hard to get new one.  On call surgeon OK w holding off on replacement since... tol PO well Min soreness Walking well  Objective:  Vital signs:  Filed Vitals:   07/07/13 0047 07/07/13 0327 07/07/13 0551 07/07/13 0800  BP:   121/78   Pulse:   71   Temp:   98.1 F (36.7 C)   TempSrc:   Oral   Resp: 16 16 16 16   Height:      Weight:      SpO2: 97% 98% 94% 95%    Last BM Date: 07/04/13  Intake/Output   Yesterday:  04/24 0701 - 04/25 0700 In: 2537.5 [I.V.:2537.5] Out: 1520 [Urine:1400; Drains:120] This shift:  Total I/O In: 240 [P.O.:240] Out: 25 [Urine:800; Drains:20]  Bowel function:  Flatus: y  BM: loose x2  Drain: min serosanguinous  Physical Exam:  General: Pt awake/alert/oriented x4 in no acute distress Eyes: PERRL, normal EOM.  Sclera clear.  No icterus Neuro: CN II-XII intact w/o focal sensory/motor deficits. Lymph: No head/neck/groin lymphadenopathy Psych:  No delerium/psychosis/paranoia HENT: Normocephalic, Mucus membranes moist.  No thrush Neck: Supple, No tracheal deviation Chest: No chest wall pain w good excursion CV:  Pulses intact.  Regular rhythm MS: Normal AROM mjr joints.  No obvious deformity Abdomen: Soft.  Nondistended.  Obese.  Mildly tender at clean dressings/incisions only.  No evidence of peritonitis.  No incarcerated hernias. Ext:  SCDs  BLE.  No mjr edema.  No cyanosis Skin: No petechiae / purpura   Problem List:   Principal Problem:   Colon cancer s/p lap assisted sigmoid colectomy 07/05/13 Active Problems:   Heart palpitations   PSVT (paroxysmal supraventricular tachycardia) - history of   Diabetes mellitus type 2 in obese   Hypertension   Morbid obesity with BMI of 45.0-49.9, adult   Exertional dyspnea   Assessment  Diana Davidson  63 y.o. female  2 Days Post-Op  Procedure(s): LAPAROSCOPIC ASSISTED SIGMOID COLECTOMY AND PROCTOSCOPY  Recovering  Plan:  -adv full liquids -PO pain control -try to hold off on replacing IV - replace if n/v/dehydration/needing IV meds -f/u path -VTE prophylaxis- SCDs, etc -mobilize as tolerated to help recovery  Adin Hector, M.D., F.A.C.S. Gastrointestinal and Minimally Invasive Surgery [facility] [address], [place], [place] [address] [phone] Main / Paging   07/07/2013   Results:   Labs: Results for orders placed during the hospital encounter of 07/05/13 (from the past 48 hour(s))  BASIC METABOLIC PANEL     Status: Abnormal   Collection Time    07/06/13  4:10 AM      Result Value Ref Range   Sodium 135 (*) 137 - 147 mEq/L   Potassium 3.6 (*) 3.7 - 5.3 mEq/L   Chloride 96  96 - 112 mEq/L   CO2 26  19 - 32 mEq/L   Glucose, Bld 205 (*) 70 - 99 mg/dL  BUN 11  6 - 23 mg/dL   Creatinine, Ser 0.71  0.50 - 1.10 mg/dL   Calcium 9.0  8.4 - 10.5 mg/dL   GFR calc non Af Amer >90  >90 mL/min   GFR calc Af Amer >90  >90 mL/min   Comment: (NOTE)     The eGFR has been calculated using the CKD EPI equation.     This calculation has not been validated in all clinical situations.     eGFR's persistently <90 mL/min signify possible Chronic Kidney     Disease.  CBC     Status: Abnormal   Collection Time    07/06/13  4:10 AM      Result Value Ref Range   WBC 14.4 (*) 4.0 - 10.5 K/uL   RBC 3.83 (*) 3.87 - 5.11 MIL/uL    Hemoglobin 10.0 (*) 12.0 - 15.0 g/dL   HCT 30.4 (*) 36.0 - 46.0 %   MCV 79.4  78.0 - 100.0 fL   MCH 26.1  26.0 - 34.0 pg   MCHC 32.9  30.0 - 36.0 g/dL   RDW 14.1  11.5 - 15.5 %   Platelets 344  150 - 400 K/uL  GLUCOSE, CAPILLARY     Status: Abnormal   Collection Time    07/06/13  5:47 PM      Result Value Ref Range   Glucose-Capillary 131 (*) 70 - 99 mg/dL  GLUCOSE, CAPILLARY     Status: Abnormal   Collection Time    07/07/13  1:06 AM      Result Value Ref Range   Glucose-Capillary 145 (*) 70 - 99 mg/dL  GLUCOSE, CAPILLARY     Status: Abnormal   Collection Time    07/07/13  7:11 AM      Result Value Ref Range   Glucose-Capillary 125 (*) 70 - 99 mg/dL    Imaging / Studies: No results found.  Medications / Allergies: per chart  Antibiotics: Anti-infectives   Start     Dose/Rate Route Frequency Ordered Stop   07/05/13 1330  cefoTEtan (CEFOTAN) 2 g in dextrose 5 % 50 mL IVPB     2 g 100 mL/hr over 30 Minutes Intravenous Every 12 hours 07/05/13 1321 07/05/13 1536   07/05/13 0531  cefoTEtan (CEFOTAN) 2 g in dextrose 5 % 50 mL IVPB     2 g 100 mL/hr over 30 Minutes Intravenous On call to O.R. 07/05/13 0531 07/05/13 0745       Note: This dictation was prepared with Dragon/digital dictation along with Smartphrase technology. Any transcriptional errors that result from this process are unintentional.

## 2013-07-08 LAB — GLUCOSE, CAPILLARY
GLUCOSE-CAPILLARY: 121 mg/dL — AB (ref 70–99)
GLUCOSE-CAPILLARY: 84 mg/dL (ref 70–99)
GLUCOSE-CAPILLARY: 87 mg/dL (ref 70–99)
GLUCOSE-CAPILLARY: 101 mg/dL — AB (ref 70–99)

## 2013-07-08 MED ORDER — ALVIMOPAN 12 MG PO CAPS
12.0000 mg | ORAL_CAPSULE | Freq: Two times a day (BID) | ORAL | Status: DC
  Administered 2013-07-08 – 2013-07-09 (×3): 12 mg via ORAL
  Filled 2013-07-08 (×4): qty 1

## 2013-07-08 NOTE — Progress Notes (Signed)
General Surgery Note  LOS: 3 days  POD -  3 Days Post-Op  Assessment/Plan: 1.  LAPAROSCOPIC ASSISTED SIGMOID COLECTOMY AND PROCTOSCOPY - 07/05/2013 - [facility]  On pureed diet.  Looks good.  2.  DVT prophylaxis - SQ Heparin 3.  Diabetes mellitus 4.  HTN 5.  Obesity   Principal Problem:   Colon cancer s/p lap assisted sigmoid colectomy 07/05/13 Active Problems:   Heart palpitations   PSVT (paroxysmal supraventricular tachycardia) - history of   Diabetes mellitus type 2 in obese   Hypertension   Morbid obesity with BMI of 45.0-49.9, adult   Exertional dyspnea  Subjective:  Doing well.  Not taking much PO.  Objective:   Filed Vitals:   07/08/13 0609  BP: 128/58  Pulse: 92  Temp: 98.4 F (36.9 C)  Resp: 22     Intake/Output from previous day:  04/25 0701 - 04/26 0700 In: 1240 [P.O.:840; I.V.:400] Out: 2165 [Urine:2000; Drains:165]  Intake/Output this shift:  Total I/O In: 240 [P.O.:240] Out: 0    Physical Exam:   General: Obese wF who is alert and oriented.    HEENT: Normal. Pupils equal. .   Lungs: Clear.  IS at 1,600 cc   Abdomen: Soft, but quiet.   Wound: Okay.  Has LLQ drain - 165 cc recorded yesterday.   Lab Results:    Recent Labs  07/06/13 0410  WBC 14.4*  HGB 10.0*  HCT 30.4*  PLT 344    BMET   Recent Labs  07/06/13 0410  NA 135*  K 3.6*  CL 96  CO2 26  GLUCOSE 205*  BUN 11  CREATININE 0.71  CALCIUM 9.0    PT/INR  No results found for this basename: LABPROT, INR,  in the last 72 hours  ABG  No results found for this basename: PHART, PCO2, PO2, HCO3,  in the last 72 hours   Studies/Results:  No results found.   Anti-infectives:   Anti-infectives   Start     Dose/Rate Route Frequency Ordered Stop   07/05/13 1330  cefoTEtan (CEFOTAN) 2 g in dextrose 5 % 50 mL IVPB     2 g 100 mL/hr over 30 Minutes Intravenous Every 12 hours 07/05/13 1321 07/05/13 1536   07/05/13 0531  cefoTEtan (CEFOTAN) 2 g in dextrose 5 % 50 mL IVPB     2  g 100 mL/hr over 30 Minutes Intravenous On call to O.R. 07/05/13 0531 07/05/13 0745      Alphonsa Overall, MD, FACS Pager: [facility] Office: [phone] 07/08/2013

## 2013-07-08 NOTE — Progress Notes (Signed)
Pt reported to previous nurse that she had 2 bowel movements, so she dc/d Entereg and did not give Sat night's dose. Earlier today I asked pt details about her stool and she reported that is was a very small amount of liquid stool yesterday, but none today. I thought she needed to have a more substantial BM to have her Entereg dc/d so I called pharmacy and had them restart it.

## 2013-07-09 LAB — GLUCOSE, CAPILLARY
GLUCOSE-CAPILLARY: 118 mg/dL — AB (ref 70–99)
Glucose-Capillary: 85 mg/dL (ref 70–99)
Glucose-Capillary: 109 mg/dL — ABNORMAL HIGH (ref 70–99)
Glucose-Capillary: 101 mg/dL — ABNORMAL HIGH (ref 70–99)

## 2013-07-09 MED ORDER — OXYCODONE HCL 5 MG PO TABS: 5.0000 mg | ORAL_TABLET | ORAL | Status: DC | PRN

## 2013-07-09 NOTE — Discharge Instructions (Addendum)
[facility], [place] [phone]  OPEN ABDOMINAL SURGERY: POST OP INSTRUCTIONS  Always review your discharge instruction sheet given to you by the facility where your surgery was performed.  IF YOU HAVE DISABILITY OR FAMILY LEAVE FORMS, YOU MUST BRING THEM TO THE OFFICE FOR PROCESSING.  PLEASE DO NOT GIVE THEM TO YOUR DOCTOR.  1. A prescription for pain medication may be given to you upon discharge.  Take your pain medication as prescribed, if needed.  If narcotic pain medicine is not needed, then you may take acetaminophen (Tylenol) or ibuprofen (Advil) as needed. 2. Take your usually prescribed medications unless otherwise directed. 3. If you need a refill on your pain medication, please contact your pharmacy. They will contact our office to request authorization.  Prescriptions will not be filled after 5pm or on week-ends. 4. You should follow a lowfat diet.  Drink plenty of fluids. 5. Most patients will experience some swelling and bruising in the area of the incision. Ice pack will help. Swelling and bruising can take several days to resolve..  6. It is common to experience some constipation if taking pain medication after surgery.  Increasing fluid intake and taking a stool softener will usually help or prevent this problem from occurring.  A mild laxative (Milk of Magnesia or Miralax) should be taken according to package directions if there are no bowel movements after 48 hours. 7.  You may have steri-strips (small skin tapes) in place directly over the incision.  These strips should be left on the skin for 7-10 days.  If your surgeon used skin glue on the incision, you may shower in 24 hours.  The glue will flake off over the next 2-3 weeks.  Any sutures or staples will be removed at the office during your follow-up visit. You may find that a light gauze bandage over your incision may keep your staples from being rubbed or pulled. You may shower and replace the bandage  daily. 8. ACTIVITIES:  You may resume regular (light) daily activities beginning the next day--such as daily self-care, walking, climbing stairs--gradually increasing activities as tolerated.  You may have sexual intercourse when it is comfortable.  Refrain from any heavy lifting or straining-nothing over 10 pounds for 6 weeks. a. You may drive when you no longer are taking prescription pain medication, you can comfortably wear a seatbelt, and you can safely maneuver your car and apply brakes b. Return to Work: _When released by MD.__________________________________ 9. You should see your doctor in the office for a follow-up appointment approximately two weeks after your surgery.  Make sure that you call for this appointment within a day or two after you arrive home to insure a convenient appointment time. OTHER INSTRUCTIONS:  _Office will call you to make an appointment to remove staples.____________________________________________________________ _____________________________________________________________  WHEN TO CALL YOUR DOCTOR: 1. Fever over 101.5 2. Inability to urinate 3. Nausea and/or vomiting 4. Extreme swelling or bruising 5. Continued bleeding from incision. 6. Increased pain, redness, or drainage from the incision.  The clinic staff is available to answer your questions during regular business hours.  Please dont hesitate to call and ask to speak to one of the nurses if you have concerns.  For further questions, please visit [email or web address]

## 2013-07-09 NOTE — Progress Notes (Signed)
4 Days Post-Op  Subjective: Passing gas but no BM.  Objective: Vital signs in last 24 hours: Temp:  [98.3 F (36.8 C)-98.5 F (36.9 C)] 98.5 F (36.9 C) (04/27 0516) Pulse Rate:  [82-88] 84 (04/27 0516) Resp:  [18-20] 18 (04/27 0516) BP: (124-143)/(70-81) 143/81 mmHg (04/27 0516) SpO2:  [96 %-97 %] 96 % (04/27 0516) Last BM Date: 07/04/13  Intake/Output from previous day: 04/26 0701 - 04/27 0700 In: 960 [P.O.:960] Out: 2050 [Urine:1900; Drains:150] Intake/Output this shift:    PE: General- In NAD Abdomen-soft, incisions clean and intact, serous drain output  Lab Results:  No results found for this basename: WBC, HGB, HCT, PLT,  in the last 72 hours BMET No results found for this basename: NA, K, CL, CO2, GLUCOSE, BUN, CREATININE, CALCIUM,  in the last 72 hours PT/INR No results found for this basename: LABPROT, INR,  in the last 72 hours Comprehensive Metabolic Panel:    Component Value Date/Time   NA 135* 07/06/2013 0410   NA 136* 06/26/2013 0910   K 3.6* 07/06/2013 0410   K 3.3* 06/26/2013 0910   CL 96 07/06/2013 0410   CL 93* 06/26/2013 0910   CO2 26 07/06/2013 0410   CO2 27 06/26/2013 0910   BUN 11 07/06/2013 0410   BUN 20 06/26/2013 0910   CREATININE 0.71 07/06/2013 0410   CREATININE 0.98 06/26/2013 0910   CREATININE 0.91 05/17/2013 1439   GLUCOSE 205* 07/06/2013 0410   GLUCOSE 148* 06/26/2013 0910   CALCIUM 9.0 07/06/2013 0410   CALCIUM 9.6 06/26/2013 0910   AST 22 06/26/2013 0910   ALT 19 06/26/2013 0910   ALKPHOS 105 06/26/2013 0910   BILITOT 0.3 06/26/2013 0910   PROT 7.2 06/26/2013 0910   ALBUMIN 3.9 06/26/2013 0910     Studies/Results: No results found.  Anti-infectives: Anti-infectives   Start     Dose/Rate Route Frequency Ordered Stop   07/05/13 1330  cefoTEtan (CEFOTAN) 2 g in dextrose 5 % 50 mL IVPB     2 g 100 mL/hr over 30 Minutes Intravenous Every 12 hours 07/05/13 1321 07/05/13 1536   07/05/13 0531  cefoTEtan (CEFOTAN) 2 g in dextrose 5 % 50 mL IVPB      2 g 100 mL/hr over 30 Minutes Intravenous On call to O.R. 07/05/13 0531 07/05/13 0745      Assessment Principal Problem:   Colon cancer s/p lap assisted sigmoid colectomy 07/05/13-bowel function returning Active Problems:   Diabetes mellitus type 2-well controlled   Hypertension   Morbid obesity with BMI of 45.0-49.9, adult   :    LOS: 4 days   Plan:  Advance to solid diet today.   Rhunette Croft Lisanne Ponce 07/09/2013

## 2013-07-10 ENCOUNTER — Telehealth (INDEPENDENT_AMBULATORY_CARE_PROVIDER_SITE_OTHER): Payer: Self-pay

## 2013-07-10 LAB — GLUCOSE, CAPILLARY: GLUCOSE-CAPILLARY: 124 mg/dL — AB (ref 70–99)

## 2013-07-10 NOTE — Discharge Summary (Signed)
Physician Discharge Summary  Patient ID: Diana Davidson MRN: [id number] DOB/AGE: [birth year] 62 y.o.  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 Pathology came back no residual disease the polypectomy site. Lymph nodes were negative. This was consistent with stage I disease. She had return of bowel function and we slowly advanced her diet. She was ambulating independently. Blood sugars were under good control. By her fifth postoperative day she is tolerating a solid diet, she was having bowel movements, pain was adequately controlled with oral analgesics, the drain was removed, she was able to be discharged. Discharge instructions were given to her.She will follow up in the office later this week to have her staples removed.  Consults: None  Significant Diagnostic Studies: none  Treatments: surgery: Laparoscopic-assisted sigmoid colectomy  Discharge Exam: Blood pressure 133/79, pulse 71, temperature 98.1 F (36.7 C), temperature source Oral, resp. rate 18, height 5\' 1"  (1.549 m), weight 251 lb (113.853 kg), SpO2 98.00%.   Disposition: 01-Home or Self Care   Future Appointments Provider Department Dept Phone   07/25/2013 9:00 AM Odis Hollingshead, MD [facility], [place] [phone]       Medication List         CALCIUM CITRATE + D3 PO  Take 1 tablet by mouth 2 (two) times daily.     cetirizine 10 MG tablet  Commonly known as:  ZYRTEC  Take 10 mg by mouth daily.     Co Q 10 100 MG Caps  Take 100 mg by mouth daily.     DULoxetine 60 MG capsule   Commonly known as:  CYMBALTA  Take 60 mg by mouth every morning.     esomeprazole 40 MG capsule  Commonly known as:  NEXIUM  Take 40 mg by mouth daily before breakfast.     ibuprofen 200 MG tablet  Commonly known as:  ADVIL,MOTRIN  Take 400 mg by mouth every 6 (six) hours as needed for moderate pain.     ketotifen 0.025 % ophthalmic solution  Commonly known as:  ZADITOR  Place 1 drop into both eyes as needed (Dry eyes).     LORazepam 1 MG tablet  Commonly known as:  ATIVAN  Take 1 mg by mouth 3 (three) times daily.     losartan-hydrochlorothiazide 50-12.5 MG per tablet  Commonly known as:  HYZAAR  Take 1 tablet by mouth 2 (two) times daily.     metoprolol tartrate 25 MG tablet  Commonly known as:  LOPRESSOR  Take 1 tablet (25 mg total) by mouth 2 (two) times daily.     Omega-3 Krill Oil 500 MG Caps  Take by mouth.     oxyCODONE 5 MG immediate release tablet  Commonly known as:  Oxy IR/ROXICODONE  Take 1-2 tablets (5-10 mg total) by mouth every 4 (four) hours as needed for moderate pain, severe pain or breakthrough pain.     simvastatin 20 MG tablet  Commonly known as:  ZOCOR  Take 20 mg by mouth every evening.     VICKS SINEX 12 HOUR 0.05 % nasal spray  Generic drug:  oxymetazoline  Place 1 spray into both nostrils daily as needed for congestion.     Vitamin D-3 1000 UNITS Caps  Take 2,000 Units by mouth daily.         Signed: Rhunette Croft Sharleen Szczesny 07/10/2013, 7:51 AM

## 2013-07-10 NOTE — Progress Notes (Signed)
5 Days Post-Op  Subjective: Tolerating diet and oral pain meds.  Had a BM  Objective: Vital signs in last 24 hours: Temp:  [98.1 F (36.7 C)-98.7 F (37.1 C)] 98.1 F (36.7 C) (04/28 0544) Pulse Rate:  [71-77] 71 (04/28 0544) Resp:  [18] 18 (04/28 0544) BP: (133-141)/(79-91) 133/79 mmHg (04/28 0544) SpO2:  [98 %-100 %] 98 % (04/28 0544) Last BM Date: 07/04/13  Intake/Output from previous day: 04/27 0701 - 04/28 0700 In: 2040 [P.O.:2040] Out: 2881 [Urine:2675; Drains:205; Stool:1] Intake/Output this shift:    PE: General- In NAD Abdomen-soft, incisions clean and intact, serous drain output  Lab Results:  No results found for this basename: WBC, HGB, HCT, PLT,  in the last 72 hours BMET No results found for this basename: NA, K, CL, CO2, GLUCOSE, BUN, CREATININE, CALCIUM,  in the last 72 hours PT/INR No results found for this basename: LABPROT, INR,  in the last 72 hours Comprehensive Metabolic Panel:    Component Value Date/Time   NA 135* 07/06/2013 0410   NA 136* 06/26/2013 0910   K 3.6* 07/06/2013 0410   K 3.3* 06/26/2013 0910   CL 96 07/06/2013 0410   CL 93* 06/26/2013 0910   CO2 26 07/06/2013 0410   CO2 27 06/26/2013 0910   BUN 11 07/06/2013 0410   BUN 20 06/26/2013 0910   CREATININE 0.71 07/06/2013 0410   CREATININE 0.98 06/26/2013 0910   CREATININE 0.91 05/17/2013 1439   GLUCOSE 205* 07/06/2013 0410   GLUCOSE 148* 06/26/2013 0910   CALCIUM 9.0 07/06/2013 0410   CALCIUM 9.6 06/26/2013 0910   AST 22 06/26/2013 0910   ALT 19 06/26/2013 0910   ALKPHOS 105 06/26/2013 0910   BILITOT 0.3 06/26/2013 0910   PROT 7.2 06/26/2013 0910   ALBUMIN 3.9 06/26/2013 0910     Studies/Results: No results found.  Anti-infectives: Anti-infectives   Start     Dose/Rate Route Frequency Ordered Stop   07/05/13 1330  cefoTEtan (CEFOTAN) 2 g in dextrose 5 % 50 mL IVPB     2 g 100 mL/hr over 30 Minutes Intravenous Every 12 hours 07/05/13 1321 07/05/13 1536   07/05/13 0531  cefoTEtan (CEFOTAN)  2 g in dextrose 5 % 50 mL IVPB     2 g 100 mL/hr over 30 Minutes Intravenous On call to O.R. 07/05/13 0531 07/05/13 0745      Assessment Principal Problem:   Colon cancer s/p lap assisted sigmoid colectomy 07/05/13-progressing well; Path consistent with Stage I Active Problems:   Diabetes mellitus type 2-well controlled   Hypertension   Morbid obesity with BMI of 45.0-49.9, adult   :    LOS: 5 days   Plan:  Remove drain-done.  Discharge.  Instructions given.   Rhunette Croft Carrigan Delafuente 07/10/2013

## 2013-07-10 NOTE — Telephone Encounter (Signed)
LMOV pt has appt for staple removal on 07/13/13 at 10:00 a.m.

## 2013-07-13 ENCOUNTER — Encounter (HOSPITAL_COMMUNITY): Payer: Self-pay | Admitting: Emergency Medicine

## 2013-07-13 ENCOUNTER — Emergency Department (HOSPITAL_COMMUNITY)
Admission: EM | Admit: 2013-07-13 | Discharge: 2013-07-13 | Disposition: A | Discharge status: Home / Self Care | Payer: BC Managed Care – PPO | Attending: Emergency Medicine | Admitting: Emergency Medicine

## 2013-07-13 ENCOUNTER — Ambulatory Visit (INDEPENDENT_AMBULATORY_CARE_PROVIDER_SITE_OTHER): Payer: BC Managed Care – PPO

## 2013-07-13 ENCOUNTER — Telehealth (INDEPENDENT_AMBULATORY_CARE_PROVIDER_SITE_OTHER): Payer: Self-pay

## 2013-07-13 DIAGNOSIS — Z862 Personal history of diseases of the blood and blood-forming organs and certain disorders involving the immune mechanism: Secondary | ICD-10-CM | POA: Insufficient documentation

## 2013-07-13 DIAGNOSIS — Z8742 Personal history of other diseases of the female genital tract: Secondary | ICD-10-CM | POA: Insufficient documentation

## 2013-07-13 DIAGNOSIS — Z6841 Body Mass Index (BMI) 40.0 and over, adult: Secondary | ICD-10-CM | POA: Insufficient documentation

## 2013-07-13 DIAGNOSIS — I471 Supraventricular tachycardia, unspecified: Secondary | ICD-10-CM | POA: Insufficient documentation

## 2013-07-13 DIAGNOSIS — M129 Arthropathy, unspecified: Secondary | ICD-10-CM | POA: Insufficient documentation

## 2013-07-13 DIAGNOSIS — E785 Hyperlipidemia, unspecified: Secondary | ICD-10-CM | POA: Insufficient documentation

## 2013-07-13 DIAGNOSIS — I10 Essential (primary) hypertension: Secondary | ICD-10-CM | POA: Insufficient documentation

## 2013-07-13 DIAGNOSIS — IMO0001 Reserved for inherently not codable concepts without codable children: Secondary | ICD-10-CM | POA: Insufficient documentation

## 2013-07-13 DIAGNOSIS — Y836 Removal of other organ (partial) (total) as the cause of abnormal reaction of the patient, or of later complication, without mention of misadventure at the time of the procedure: Secondary | ICD-10-CM | POA: Insufficient documentation

## 2013-07-13 DIAGNOSIS — Z880 Allergy status to penicillin: Secondary | ICD-10-CM | POA: Insufficient documentation

## 2013-07-13 DIAGNOSIS — K219 Gastro-esophageal reflux disease without esophagitis: Secondary | ICD-10-CM | POA: Insufficient documentation

## 2013-07-13 DIAGNOSIS — Z85038 Personal history of other malignant neoplasm of large intestine: Secondary | ICD-10-CM | POA: Insufficient documentation

## 2013-07-13 DIAGNOSIS — F410 Panic disorder [episodic paroxysmal anxiety] without agoraphobia: Secondary | ICD-10-CM | POA: Insufficient documentation

## 2013-07-13 DIAGNOSIS — T8130XA Disruption of wound, unspecified, initial encounter: Secondary | ICD-10-CM

## 2013-07-13 DIAGNOSIS — T8131XA Disruption of external operation (surgical) wound, not elsewhere classified, initial encounter: Secondary | ICD-10-CM | POA: Insufficient documentation

## 2013-07-13 DIAGNOSIS — Z79899 Other long term (current) drug therapy: Secondary | ICD-10-CM | POA: Insufficient documentation

## 2013-07-13 NOTE — ED Notes (Signed)
Case manager contacted per general surgery MD for abdominal dressing changes to start tomorrow by home health.

## 2013-07-13 NOTE — ED Notes (Addendum)
EDCM at bedside for consult to set up home care dressing change services. Pt aware it may be a day or two before a nurse is available to see pt in her home.  Pt given dressing change supplies and again instructed (previously by Dr. Tomi Bamberger) on usage.  Pt verbalized understanding of dressing changes and reports she "used to have to do it for my mom".

## 2013-07-13 NOTE — Telephone Encounter (Signed)
Called pt to check on her and she states that she is headed to the ER, she was unable to stop the bleeding.

## 2013-07-13 NOTE — Progress Notes (Signed)
She developed a superficial wound separation at the inferior aspect of her lower midline wound were her pannus is.  This will need daily saline damp to dry dressing changes to allow it to heal by secondary intention.  Will arrange for [facility] to start this tomorrow.  Discussed with her and her ED RN.

## 2013-07-13 NOTE — Discharge Instructions (Signed)
Wound Dehiscence  Wound dehiscence is when a surgical cut (incision) breaks open and does not heal properly after surgery. It usually happens 7 10 days after surgery. This can be a serious condition. It is important to identify and treat this condition early.   CAUSES   Some common causes of wound dehiscence include:  · Stretching of the wound area. This may be caused by lifting, vomiting, violent coughing, or straining during bowel movements.  · Wound infection.  · Early stitch (suture) removal.  RISK FACTORS  Various things can increase your risk of developing wound dehiscence, including:  · Obesity.  · Lung disease.  · Smoking.  · Poor nutrition.  · Contamination during surgery.  SIGNS AND SYMPTOMS  · Bleeding from the wound.  · Pain.  · Fever.  · Wound starts breaking open.  DIAGNOSIS   · Your health care provider may diagnose wound dehiscence by monitoring the incision and noting any changes in the wound. These changes can include an increase in drainage or pain. The health care provider may also ask you if you have noticed any stretching or tearing of the wound.  · Wound cultures may be taken to determine if there is an infection.   · Imaging studies, such as an MRI scan or CT scan, may be done to determine if there is a collection of pus or fluid in the wound area.  TREATMENT  Treatment may include:  · Wound care.  · Surgical repair.  · Antibiotic medicine to treat or prevent infection.  · Medicines to reduce pain and swelling.  HOME CARE INSTRUCTIONS   · Only take over-the-counter or prescription medicines for pain, discomfort, or fever as directed by your health care provider. Taking pain medicine 30 minutes before changing a bandage (dressing) can help relieve pain.  · Take your antibiotics as directed. Finish them even if you start to feel better.  · Gently wash the area with mild soap and water 2 times a day, or as directed. Rinse off the soap. Pat the area dry with a clean towel. Do not rub the wound.  This may cause bleeding.  · Follow your health care provider's instructions for how often you need to change the dressing and packing inside. Wash your hands well before and after changing your dressing. Apply a dressing to the wound as directed.  · Take showers. Do not take tub baths, swim, or do anything that may soak the wound until it is healed.  · Avoid exercises that make you sweat heavily.  · Use anti-itch medicine as directed by your health care provider. The wound may itch when it is healing. Do not pick or scratch at the wound.  · Do not lift more than 10 pounds (4.5 kg) until the wound is healed, or as directed by your health care provider.  · Keep all follow-up appointments as directed.  SEEK MEDICAL CARE IF:  · You have excessive bleeding from your surgical wound.  · Your wound does not seem to be healing properly.  SEEK IMMEDIATE MEDICAL CARE IF:   · You have increased swelling or redness around the wound.  · You have increasing pain in the wound.  · You have an increasing amount of pus coming from the wound.  · Your wound breaks open farther.  · You have a fever.  MAKE SURE YOU:   · Understand these instructions.  · Will watch your condition.  · Will get help right away if you are not 

## 2013-07-13 NOTE — Telephone Encounter (Signed)
Pt was seen in the office today to remove several staples. Glenda placed steri strips on the end of incision. Pt now states the incision is open at the end and is draining blood. Informed pt to pinch site and apply ice, to see if this stops the bleeding. If this does not stop bleeding she will need to be seen in the ER at that time.

## 2013-07-13 NOTE — ED Provider Notes (Signed)
CSN: [id number]     Arrival date & time 07/13/13  1538 History   First MD Initiated Contact with Patient 07/13/13 1628     Chief Complaint  Patient presents with  . Post-op Problem   HPI Patient presents to the emergency room for evaluation of a wound dehiscence.  The patient had Laparoscopic assisted sigmoid colectomy and mobilization of the splenic flexure performed on April 23. Patient went to the general surgeon's office today for staple removal. All staples were being removed the nurse did note that the patient had a wound dehiscence at the inferior aspect of surgical wound.  Steri-Strips were placed.  The patient noticed that later during the day she had bleeding from the wound. Patient called the office and was told to apply pressure and ice. Patient was concerned about her symptoms and came to the emergency room for evaluation. She is no longer having any bleeding. She denies fevers or chills or other complications.  Past Medical History  Diagnosis Date  . Osteopenia   . PSVT (paroxysmal supraventricular tachycardia)   . Heart palpitations     PSVT, PVCs, PACS - all short bursts, no syncope  . Hypertension   . Morbid obesity with BMI of 45.0-49.9, adult   . Arthritis   . GERD (gastroesophageal reflux disease)   . Hyperlipidemia   . Neuromuscular disorder   . Fibromyalgia   . Colon cancer   . Stress incontinence   . Anemia   . Diabetes mellitus without complication 05/5571    [id number]  - Borderline diabetes  . Anxiety     hx panic attacks - none for several years   Past Surgical History  Procedure Laterality Date  . Nm pet img alzheimers    . Doppler echocardiography  03/03/2011    EF greater than 55; Moderate concentric LVH, grade 1 1 diastolic dysfunction with mildly elevated LVEDP/LAP; RV pressure 30-40 mmHg;   . Nm myoview ltd  05/15/2013    INTERMEDIATE RISK - small area of inferior reversibility suggestive of possible ischemia. Cannot exclude bowel artifact.  .  Shoulder surgery    . Bunionectomy      LEFT  . Laparoscopic partial colectomy N/A 07/05/2013    Procedure: LAPAROSCOPIC ASSISTED SIGMOID COLECTOMY AND PROCTOSCOPY;  Surgeon: Odis Hollingshead, MD;  Location: [facility];  Service: General;  Laterality: N/A;   Family History  Problem Relation Age of Onset  . Diabetes Mother   . Hypertension Mother   . Alzheimer's disease Mother   . Cancer Paternal Aunt     ovarian   History  Substance Use Topics  . Smoking status: Never Smoker   . Smokeless tobacco: Not on file  . Alcohol Use: No   OB History   Grav Para Term Preterm Abortions TAB SAB Ect Mult Living                 Review of Systems  All other systems reviewed and are negative.     Allergies  Novocain; Prednisone; Sulfa antibiotics; and Penicillins  Home Medications   Prior to Admission medications   Medication Sig Start Date End Date Taking? Authorizing Provider  Calcium Citrate-Vitamin D (CALCIUM CITRATE + D3 PO) Take 1 tablet by mouth 2 (two) times daily.     Historical Provider, MD  cetirizine (ZYRTEC) 10 MG tablet Take 10 mg by mouth daily.    Historical Provider, MD  Cholecalciferol (VITAMIN D-3) 1000 UNITS CAPS Take 2,000 Units by mouth daily.  Historical Provider, MD  Coenzyme Q10 (CO Q 10) 100 MG CAPS Take 100 mg by mouth daily.     Historical Provider, MD  DULoxetine (CYMBALTA) 60 MG capsule Take 60 mg by mouth every morning.     Historical Provider, MD  esomeprazole (NEXIUM) 40 MG capsule Take 40 mg by mouth daily before breakfast.    Historical Provider, MD  ibuprofen (ADVIL,MOTRIN) 200 MG tablet Take 400 mg by mouth every 6 (six) hours as needed for moderate pain.    Historical Provider, MD  ketotifen (ZADITOR) 0.025 % ophthalmic solution Place 1 drop into both eyes as needed (Dry eyes).    Historical Provider, MD  LORazepam (ATIVAN) 1 MG tablet Take 1 mg by mouth 3 (three) times daily.  09/17/12   Historical Provider, MD  losartan-hydrochlorothiazide (HYZAAR)  50-12.5 MG per tablet Take 1 tablet by mouth 2 (two) times daily.     Historical Provider, MD  metoprolol tartrate (LOPRESSOR) 25 MG tablet Take 1 tablet (25 mg total) by mouth 2 (two) times daily. 09/28/12   Leonie Man, MD  Omega-3 Krill Oil 500 MG CAPS Take by mouth.    Historical Provider, MD  oxyCODONE (OXY IR/ROXICODONE) 5 MG immediate release tablet Take 1-2 tablets (5-10 mg total) by mouth every 4 (four) hours as needed for moderate pain, severe pain or breakthrough pain. 07/09/13   Odis Hollingshead, MD  oxymetazoline (VICKS SINEX 12 HOUR) 0.05 % nasal spray Place 1 spray into both nostrils daily as needed for congestion.    Historical Provider, MD  simvastatin (ZOCOR) 20 MG tablet Take 20 mg by mouth every evening.    Historical Provider, MD   BP 142/83  Pulse 95  Temp(Src) 98.5 F (36.9 C) (Oral)  Resp 18  SpO2 100% Physical Exam  Nursing note and vitals reviewed. Constitutional: She appears well-developed and well-nourished. No distress.  HENT:  Head: Normocephalic and atraumatic.  Right Ear: External ear normal.  Left Ear: External ear normal.  Eyes: Conjunctivae are normal. Right eye exhibits no discharge. Left eye exhibits no discharge. No scleral icterus.  Neck: Neck supple. No tracheal deviation present.  Cardiovascular: Normal rate.   Pulmonary/Chest: Effort normal. No stridor. No respiratory distress.  Abdominal: She exhibits no distension. There is no tenderness. There is no rebound and no guarding.  1-2 cm area of wound dehiscence at the inferior aspect of her surgical wound located in the skin fold of her pannus, there is no discharge, there is no erythema, the tissue appears red and healthy, small clot noted within the wound, no active bleeding  Musculoskeletal: She exhibits no edema.  Neurological: She is alert. Cranial nerve deficit: no gross deficits.  Skin: Skin is warm and dry. No rash noted.  Psychiatric: She has a normal mood and affect.    ED Course   Procedures (including critical care time) Labs Review I applied a small amount of sterile gauze within the wound and applied a 4 x 4 sterile dressing over that period.  I discussed what to dry dressings with patient. Patient is familiar with this type of dressing. She helped her mother in the past.  MDM   Final diagnoses:  Wound dehiscence    Patient does not appear to be infected. She's not having any active bleeding. She will follow up with general surgery office. I will notify the surgeon on call to inform them of the patient's visit.    Kathalene Frames, MD 07/13/13 [phone]

## 2013-07-13 NOTE — Progress Notes (Signed)
  CARE MANAGEMENT ED NOTE 07/13/2013  Patient:  Diana Davidson, Diana Davidson   Account Number:  [id number]  Date Initiated:  07/13/2013  Documentation initiated by:  Livia Snellen  Subjective/Objective Assessment:   Patient presents to Ed with open incision  post staple removal post colon resection.     Subjective/Objective Assessment Detail:     Action/Plan:   Action/Plan Detail:   Anticipated DC Date:  07/13/2013     Status Recommendation to Physician:   Result of Recommendation:    Other ED Services  Consult Working [place]  CM consult  Other    Choice offered to / List presented to:  C-1 Patient     [place] arranged  HH-1 RN      [place]    Status of service:  Completed, signed off  ED Comments:   ED Comments Detail:  EDCM consulted to speak to patient regarding home health services.  EDCM spoke to patient and her friend Diana Davidson at bedside.  Patient is requiring a visiting RN for dressing changes.  EDCM asked patient if she required any further home health services besides a visiting RN, patient replied, "No."  Patient reports she is from [facility], but she will staying with her friend Diana Davidson over the weekend and plans on returning back home on Monday.  EDCM provided patient with list of home health agencies from [facility] and [place]. Patient has chosen [facility] for home health services as patient has used their services in the past for her mother and was very pleased.  EDCM asked patient if she would like her information to be faxed to another home health agency in effort to expidite home health services, patient replied, "No, I really like [facility]."  [facility] informed patient that this agency has 24-48 hours to contact her. EDCM provided patient with the phone number and contact information for [facility] in both [place] and [place].  EDCM instructed patient, if this agency does not call her within 24-48 hours to call  them.  Patient is aware that home health agency may not be able to see her this weekend.  [facility] faxed home health orders to [place] at [phone] at 1812pm with confirmation of receipt at 1814pm for [facility], [facility] faxed home health orders to [facility] in [place] at [phone] at 1819pm with confirmation of receipt at 1824pm.   Patient's friend's' address [place] and phone number [phone] where patient will be staying this weekend written on patient's face sheet faxed to [facility]. Patient's home address and phone number corrected and confirmed by patient and written on face sheet faxed to [place].  Patient and patient's friend thankful for services.  No further EDCM needs at this time.

## 2013-07-13 NOTE — ED Notes (Signed)
Initial Contact - pt sitting up in chair, reports had staples removed today from colon resection surgery x1 week ago.  Pt reports inferior end of incision was open after the removal of staples in the surgeon's office, office staff was aware.  Pt reports bleeding started this afternoon and pt called the office and was told to apply ice or go to ER.  Wound already packed/re-dressed by Dr. Tomi Bamberger at this time and pt awaiting d/c.  A+Ox4.  Speaking full/clear sentences.  NAD.

## 2013-07-13 NOTE — ED Notes (Signed)
Surgeon at bedside for consult.

## 2013-07-13 NOTE — Progress Notes (Unsigned)
Patient in for nurse only staple removal, afebrile , no drainage, odor ,redness noted incision site . Cleansed abdomen incision with chlorea prep ,removed 14 staples, applied steri strep glue, applied steri  Streps x 10 to incision site, patient tolerated well. Advised to call if temp 100.3 or greater, odor, redness, serous drainage from incision site. Patient verbalized understanding

## 2013-07-13 NOTE — ED Notes (Signed)
Pt had abdominal surgery last week, went to have staples removed today, and after she came home she noticed bleeding. Pt now has open incision at the lower part of her abdomen.

## 2013-07-14 NOTE — Progress Notes (Signed)
CARE MANAGEMENT NOTE 07/14/2013  Patient:  SHAVONTAE, GIBEAULT   Account Number:  [id number]  Date Initiated:  07/14/2013  Documentation initiated by:  [facility]  Subjective/Objective Assessment:     Action/Plan:   Anticipated DC Date:  07/13/2013   Anticipated DC Plan:  [place]  CM consult      [facility] Choice  HOME HEALTH   Choice offered to / List presented to:  C-1 Patient        [place] arranged  HH-1 RN      [place].   Status of service:  Completed, signed off Medicare Important Message given?   (If response is "NO", the following Medicare IM given date fields will be blank) Date Medicare IM given:   Date Additional Medicare IM given:    Discharge Disposition:  [place]  Per UR Regulation:    If discussed at Long Length of Stay Meetings, dates discussed:    Comments:  07/14/2013 1025 Received call from [facility] and they are not able to service pt for [facility]. Contacted pt and offered choice for HH. Pt requested [facility]. Notified [facility] rep for [place]. Jonnie Finner RN CCM Case Mgmt phone [phone]

## 2013-07-16 NOTE — Progress Notes (Signed)
07/16/2013 A. Marlyss Cissell RNCM 1610pm EDCM called patient for follow up.  As per patient, "[facility] siad they didn't come out to [facility]."  Patient reports she then chose [facility] who is  was currently at patient's house.  No further EDCM needs at this time.

## 2013-07-17 ENCOUNTER — Telehealth (INDEPENDENT_AMBULATORY_CARE_PROVIDER_SITE_OTHER): Payer: Self-pay

## 2013-07-17 NOTE — Telephone Encounter (Signed)
May order wound VAC

## 2013-07-17 NOTE — Telephone Encounter (Signed)
Otila Kluver from [facility] called stating pt insurance will not pay for daily [facility] visits for wound care. Pt does not have family to help with wound care. Otila Kluver asking if Dr Zella Richer okay ordering a wound vac for her wound. Please advise.Marland Kitchen

## 2013-07-18 NOTE — Telephone Encounter (Signed)
Gave verbal order for wound vac according to Dr. Zella Richer.

## 2013-07-19 NOTE — Telephone Encounter (Signed)
[facility] order for wound vac cancelled.  Notes and demographics faxed to [facility] at [facility] [phone].  They will contact the patient to set up initial appointment.  Pt made aware.

## 2013-07-19 NOTE — Telephone Encounter (Signed)
Spoke with the patient and explained she would be hearing from [facility] representative later today or tomorrow for the wound vac.

## 2013-07-25 ENCOUNTER — Ambulatory Visit (INDEPENDENT_AMBULATORY_CARE_PROVIDER_SITE_OTHER): Payer: BC Managed Care – PPO | Admitting: General Surgery

## 2013-07-25 ENCOUNTER — Encounter (INDEPENDENT_AMBULATORY_CARE_PROVIDER_SITE_OTHER): Payer: Self-pay | Admitting: General Surgery

## 2013-07-25 VITALS — BP 124/76 | HR 80 | Temp 97.8°F | Resp 14 | Wt 242.6 lb

## 2013-07-25 DIAGNOSIS — Z4889 Encounter for other specified surgical aftercare: Secondary | ICD-10-CM

## 2013-07-25 NOTE — Patient Instructions (Signed)
Continue light activities and VAC machine

## 2013-07-25 NOTE — Progress Notes (Signed)
Procedure:  Laparoscopic assisted sigmoid colectomy  Date:  07/05/2013  Pathology:  Stage I colon cancer  History:  She is here for her first postoperative visit. She had wound separation inferiorly and a VAC has been applied. Appetite is slow to come back. Bowels are moving daily.  Exam: General- Is in NAD. Abdomen-soft, trocar site incisions are clean and intact. VAC removed. Very superficial wound inferiorly with good granulation tissue present. 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 Peds superficial wound separation that is healing in secondarily with negative pressure wound therapy.  Plan:  Continue negative pressure wound therapy. Continue light activities. Return visit 3 weeks.

## 2013-08-03 ENCOUNTER — Telehealth (INDEPENDENT_AMBULATORY_CARE_PROVIDER_SITE_OTHER): Payer: Self-pay

## 2013-08-03 NOTE — Telephone Encounter (Signed)
[facility] with [facility], called to see if Dr Zella Richer would d/c the wound vac. Wound is very superficial. Pt has started to have some skin irritation around the edges of the wound vac. Ebony Hail states that she is going to apply a wet-dry dressing to the wound. If Dr Zella Richer does not want to d/c the wound vac she will go back this afternoon and replace the wound vac otherwise she would go back and see her next week. Explained to Ebony Hail I wound send Dr Zella Richer a message and let him know of her recommendations.

## 2013-08-03 NOTE — Telephone Encounter (Signed)
Okay to stop VAC and start wet-to-dry dressing changes.

## 2013-08-15 ENCOUNTER — Encounter (INDEPENDENT_AMBULATORY_CARE_PROVIDER_SITE_OTHER): Payer: Self-pay | Admitting: General Surgery

## 2013-08-15 ENCOUNTER — Ambulatory Visit (INDEPENDENT_AMBULATORY_CARE_PROVIDER_SITE_OTHER): Payer: BC Managed Care – PPO | Admitting: General Surgery

## 2013-08-15 VITALS — BP 120/80 | HR 71 | Temp 98.3°F | Resp 16 | Ht 61.0 in | Wt 245.4 lb

## 2013-08-15 DIAGNOSIS — Z4889 Encounter for other specified surgical aftercare: Secondary | ICD-10-CM

## 2013-08-15 NOTE — Patient Instructions (Signed)
Resume normal activities as tolerated, as discussed. High fiber diet. Change bandage daily until wound is completely healed.

## 2013-08-15 NOTE — Progress Notes (Signed)
Procedure:  Laparoscopic assisted sigmoid colectomy  Date:  07/05/2013  Pathology:  Stage I colon cancer  History:  She is here for her second postoperative visit. She is doing dressing changes to the open portion of the lower, wound. Energy and appetite are slowly returning. She is having 1 bowel movement a day. No rectal bleeding.  Exam: General- Is in NAD. Abdomen-soft, trocar site incisions are clean and intact. Superficial open area of the lower midline incision is almost completely healed. 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 Wound is almost completely healed.  Plan:  Activities as tolerated. Continue dry dressing changed the area of the wound completely healed. Return visit 6 months. We'll need colonoscopy one year from her last one.

## 2013-10-15 ENCOUNTER — Other Ambulatory Visit: Payer: Self-pay | Admitting: *Deleted

## 2013-10-15 MED ORDER — METOPROLOL TARTRATE 25 MG PO TABS: 25.0000 mg | ORAL_TABLET | Freq: Two times a day (BID) | ORAL | Status: DC

## 2013-11-12 ENCOUNTER — Ambulatory Visit (INDEPENDENT_AMBULATORY_CARE_PROVIDER_SITE_OTHER): Payer: BC Managed Care – PPO | Admitting: Cardiology

## 2013-11-12 ENCOUNTER — Encounter: Payer: Self-pay | Admitting: Cardiology

## 2013-11-12 VITALS — BP 150/100 | HR 76 | Ht 61.0 in | Wt 253.1 lb

## 2013-11-12 DIAGNOSIS — R0989 Other specified symptoms and signs involving the circulatory and respiratory systems: Secondary | ICD-10-CM

## 2013-11-12 DIAGNOSIS — E669 Obesity, unspecified: Secondary | ICD-10-CM

## 2013-11-12 DIAGNOSIS — E119 Type 2 diabetes mellitus without complications: Secondary | ICD-10-CM

## 2013-11-12 DIAGNOSIS — R0600 Dyspnea, unspecified: Secondary | ICD-10-CM

## 2013-11-12 DIAGNOSIS — Z6841 Body Mass Index (BMI) 40.0 and over, adult: Secondary | ICD-10-CM

## 2013-11-12 DIAGNOSIS — I10 Essential (primary) hypertension: Secondary | ICD-10-CM

## 2013-11-12 DIAGNOSIS — E8881 Metabolic syndrome: Secondary | ICD-10-CM

## 2013-11-12 DIAGNOSIS — E785 Hyperlipidemia, unspecified: Secondary | ICD-10-CM

## 2013-11-12 DIAGNOSIS — I471 Supraventricular tachycardia: Secondary | ICD-10-CM

## 2013-11-12 DIAGNOSIS — E1169 Type 2 diabetes mellitus with other specified complication: Secondary | ICD-10-CM

## 2013-11-12 DIAGNOSIS — R0609 Other forms of dyspnea: Secondary | ICD-10-CM

## 2013-11-12 DIAGNOSIS — R002 Palpitations: Secondary | ICD-10-CM

## 2013-11-12 MED ORDER — METOPROLOL TARTRATE 50 MG PO TABS: 50.0000 mg | ORAL_TABLET | Freq: Two times a day (BID) | ORAL | Status: DC

## 2013-11-12 NOTE — Patient Instructions (Signed)
Increase Metoprolol Tart. 50 mg twice a day. Take alternate 25mg  /50 mg every other day to you tolerate it.  Continue with all other medications   Your physician wants you to follow-up in 6 month Dr Ellyn Hack. You will receive a reminder letter in the mail two months in advance. If you don't receive a letter, please call our office to schedule the follow-up appointment.

## 2013-11-16 ENCOUNTER — Encounter: Payer: Self-pay | Admitting: Cardiology

## 2013-11-16 DIAGNOSIS — E8881 Metabolic syndrome: Secondary | ICD-10-CM | POA: Insufficient documentation

## 2013-11-16 DIAGNOSIS — E785 Hyperlipidemia, unspecified: Secondary | ICD-10-CM | POA: Insufficient documentation

## 2013-11-16 NOTE — Assessment & Plan Note (Signed)
I did say she probably could benefit from nutritional education therapy. She needs to also increase her exercise level now she is postop and doing better.

## 2013-11-16 NOTE — Assessment & Plan Note (Signed)
As above. Rates in the 110s to unlikely to be a SVT, just probably some sinus tachycardia. Should be better with increasedwater.

## 2013-11-16 NOTE — Assessment & Plan Note (Addendum)
Despite her relatively normal cardiac cath this year. She still has a high risk for atherosclerotic disease. Aggressive respiratory fixation with weight loss, question control and ensuring adequate lipid management is recommended.

## 2013-11-16 NOTE — Assessment & Plan Note (Signed)
On medications per PCP.

## 2013-11-16 NOTE — Progress Notes (Signed)
PCP: Geoffery Lyons, MD  Clinic Note: Chief Complaint  Patient presents with  . 6 month visit    had recent colon surgery ,no chest pain , no sob,no edema   HPI: Diana Davidson is a 63 y.o. female with a Cardiovascular Problem List below who presents today for post hospital followup after her laparoscopic partial colectomy. She has a history of PSVT as well to diabetes and underwent a stress test her preoperative cardiovascular evaluation prior to this surgery. 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 She went through surgery without any major complications from cardiac standpoint. Her postop course was somewhat complicated by wound dehiscence requiring Wound VAC therapy for couple weeks.  Interval History: He now presents today several months later and doing fine from a cardiac standpoint. She says her palpitations are pretty rare. She says sometimes her heart rate will go to the 110 range, but does not feel overly symptomatic. She's not had any syncope or near syncopal type symptoms she denies any chest pressure or significant dyspnea with her routine activities.  She does note that her legs hurt with walking but is more her joints and muscles.  Cardiovascular ROS: no chest pain or dyspnea on exertion positive for - irregular heartbeat, palpitations and rapid heart rate negative for - chest pain, edema, loss of consciousness, murmur, orthopnea, paroxysmal nocturnal dyspnea, shortness of breath or Near syncope, TIA/amaurosis fugax.:  Past Medical History  Diagnosis Date  . Osteopenia   . PSVT (paroxysmal supraventricular tachycardia)   . Heart palpitations     PSVT, PVCs, PACS - all short bursts, no syncope  . Hypertension   . Morbid obesity with BMI of 45.0-49.9, adult   . Arthritis   . GERD (gastroesophageal reflux disease)   . Hyperlipidemia   . Neuromuscular disorder     . Fibromyalgia   . Colon cancer   . Stress incontinence   . Anemia   . Diabetes mellitus without complication 10/5883    [id number]  - Borderline diabetes  . Anxiety     hx panic attacks - none for several years  . Abnormal nuclear stress test     False +; Cardiac cath with no obstructive CAD.    Prior Cardiac Evaluation and Past Surgical History: Past Surgical History  Procedure Laterality Date  . Doppler echocardiography  03/03/2011    EF greater than 55; Moderate concentric LVH, grade 1 1 diastolic dysfunction with mildly elevated LVEDP/LAP; RV pressure 30-40 mmHg;   . Nm myoview ltd  05/15/2013    INTERMEDIATE RISK - small area of inferior reversibility suggestive of possible ischemia. Cannot exclude bowel artifact. - normal cardiac cath   . Cardiac catheterization  05/2013    Non-obstructive Disease - False + Nuc ST   MEDICATIONS AND ALLERGIES REVIEWED IN EPIC No Change in Social and Family History  ROS: A comprehensive Review of Systems - was performed Review of Systems  Constitutional: Negative for fever, chills and diaphoresis.       Slowly picking up her energy level postop  HENT: Negative.   Eyes: Negative for blurred vision and double vision.  Respiratory: Negative.  Negative for cough and hemoptysis.   Gastrointestinal: Negative for abdominal pain, diarrhea, blood in stool and melena.  Genitourinary: Negative for frequency, hematuria and flank pain.  Musculoskeletal: Positive for joint pain and myalgias.       Muscles  hurt around her joints  Neurological: Negative.  Negative for dizziness, sensory change, speech change, focal weakness, seizures, loss of consciousness and weakness.  Endo/Heme/Allergies: Negative.   Psychiatric/Behavioral: Negative for depression. The patient is not nervous/anxious.   All other systems reviewed and are negative.  Wt Readings from Last 3 Encounters:  11/12/13 253 lb 1.6 oz (114.805 kg)  08/15/13 245 lb 6.4 oz (111.313 kg)  07/25/13  242 lb 9.6 oz (110.043 kg)   PHYSICAL EXAM BP 150/100  Pulse 76  Ht 5\' 1"  (1.549 m)  Wt 253 lb 1.6 oz (114.805 kg)  BMI 47.85 kg/m2 General appearance: alert, cooperative, appears stated age, no distress and morbidly obese  Neck: no adenopathy, no carotid bruit and no JVD  Lungs: clear to auscultation bilaterally, normal percussion bilaterally and non-labored  Heart: regular rate and rhythm, S1, S2 normal, no murmur, click, rub or gallop; unable to palpate PMI  Abdomen: soft, non-tender; bowel sounds normal; no masses, no organomegaly; significant truncal obesity  Extremities: extremities normal, atraumatic, no cyanosis, or edema  Pulses: 2+ and symmetric;  Neurologic: Mental status: Alert, oriented, thought content appropriate  Cranial nerves: normal (II-XII grossly intact)   Adult ECG Report  Rate: 76 ;  Rhythm: normal sinus rhythm, CRO Inf MI age undetermined ; no change   Recent Labs -- none available:   ASSESSMENT / PLAN: PSVT (paroxysmal supraventricular tachycardia) - history of Well-controlled on beta blocker. We're increase in dose for additional blood pressure control. He should keep the rapid heart rates more stable.  Essential hypertension Blood pressures are still little bit elevated today. It is a keeping things simple and her beta blocker dose in preference over her ARB.  Heart palpitations As above. Rates in the 110s to unlikely to be a SVT, just probably some sinus tachycardia. Should be better with increasedwater.  Morbid obesity with BMI of 45.0-49.9, adult I did say she probably could benefit from nutritional education therapy. She needs to also increase her exercise level now she is postop and doing better.  Exertional dyspnea Probably related to combination of things including obesity and quite likely diastolic dysfunction with late exercise. Her cardiac standpoint at the reduction is the treatment of choice.  Dyslipidemia, goal LDL below 130 but closer  to 100 Being monitored by PCP. On statin  Diabetes mellitus type 2 in obese On medications per PCP.  Metabolic syndrome: Obesity, hypertension, diabetes Despite her relatively normal cardiac cath this year. She still has a high risk for atherosclerotic disease. Aggressive respiratory fixation with weight loss, question control and ensuring adequate lipid management is recommended.   Orders Placed This Encounter  Procedures  . EKG 12-Lead   Meds ordered this encounter  Medications  . metoprolol (LOPRESSOR) 50 MG tablet    Sig: Take 1 tablet (50 mg total) by mouth 2 (two) times daily.    Dispense:  60 tablet    Refill:  11    Followup: 6 months  DAVID W. Ellyn Hack, M.D., M.S. Interventional Cardiologist [facility]

## 2013-11-16 NOTE — Assessment & Plan Note (Signed)
Well-controlled on beta blocker. We're increase in dose for additional blood pressure control. He should keep the rapid heart rates more stable.

## 2013-11-16 NOTE — Assessment & Plan Note (Signed)
Being monitored by PCP. On statin

## 2013-11-16 NOTE — Assessment & Plan Note (Signed)
Probably related to combination of things including obesity and quite likely diastolic dysfunction with late exercise. Her cardiac standpoint at the reduction is the treatment of choice.

## 2013-11-16 NOTE — Assessment & Plan Note (Signed)
Blood pressures are still little bit elevated today. It is a keeping things simple and her beta blocker dose in preference over her ARB.

## 2014-02-21 ENCOUNTER — Encounter (HOSPITAL_COMMUNITY): Payer: Self-pay | Admitting: Cardiology

## 2014-04-25 ENCOUNTER — Encounter (INDEPENDENT_AMBULATORY_CARE_PROVIDER_SITE_OTHER): Payer: Self-pay | Admitting: General Surgery

## 2014-04-25 NOTE — Progress Notes (Unsigned)
Patient ID: Diana Davidson, female   DOB: 25-Jan-1951, 64 y.o.   MRN: [id number] Diana Davidson 04/25/2014 10:55 AM Location: [facility] Patient #: [id number] DOB: 06/03/1950 Single / Language: Cleophus Molt / Race: White Female  History of Present Illness Diana Hollingshead MD; 04/25/2014 11:09 AM) Patient words: f/u.  The patient is a 64 year old female   Note:Procedure: Laparoscopic assisted sigmoid colectomy  Date: 07/05/2013  Pathology: Stage I colon cancer  History: She is here for a long-term follow up visit for her colon cancer. She is doing well. No rectal bleeding.  Exam: General- Is in NAD. Abdomen-well healed scars without masses, no hepatomegaly  Lymph nodes-no palpable supraclavicular or inguinal adenopathy.   Other Problems Marjean Donna, CMA; 04/25/2014 10:55 AM) Arthritis Colon Cancer Depression Gastroesophageal Reflux Disease High blood pressure Hypercholesterolemia  Past Surgical History Marjean Donna, CMA; 04/25/2014 10:55 AM) Colon Polyp Removal - Colonoscopy Colon Removal - Partial Foot Surgery Left. Oral Surgery Resection of Stomach Shoulder Surgery Right.  Diagnostic Studies History Marjean Donna, CMA; 04/25/2014 10:55 AM) Colonoscopy within last year Mammogram within last year Pap Smear 1-5 years ago  Allergies Marjean Donna, CMA; 04/25/2014 10:58 AM) Dilaudid *ANALGESICS - OPIOID* Novocain *LOCAL ANESTHETICS-Parenteral* PredniSONE (Pak) *CORTICOSTEROIDS* Sulfa Antibiotics Penicillamine *ASSORTED CLASSES*  Medication History (Sonya Bynum, CMA; 04/25/2014 10:57 AM) LORazepam (1MG  Tablet, Oral) Active. DULoxetine HCl (60MG  Capsule DR Part, Oral) Active. Afluria Preservative Free (0.5ML Susp Pref Syr, Intramuscular) Active. Losartan Potassium-HCTZ (50-12.5MG  Tablet, Oral) Active. Meloxicam (15MG  Tablet, Oral) Active. Metoprolol Tartrate (50MG  Tablet, Oral) Active. NexIUM (40MG  Capsule DR, Oral)  Active. Simvastatin (20MG  Tablet, Oral) Active.  Social History Marjean Donna, CMA; 04/25/2014 10:55 AM) No alcohol use No caffeine use No drug use Tobacco use Never smoker.  Family History Marjean Donna, CMA; 04/25/2014 10:55 AM) Alcohol Abuse Family Members In General. Arthritis Father, Mother. Cerebrovascular Accident Family Members In General. Diabetes Mellitus Father, Mother. Heart Disease Father. Heart disease in female family member before age 36 Hypertension Father, Mother. Kidney Disease Family Members In General. Melanoma Family Members In General. Thyroid problems Mother.  Pregnancy / Birth History Marjean Donna, [place]; 04/25/2014 10:55 AM) Age at menarche 67 years. Age of menopause <46 Contraceptive History Oral contraceptives. Gravida 0 Irregular periods Para 0  Review of Systems ([place]; 04/25/2014 10:55 AM) General Present- Fatigue. Not Present- Appetite Loss, Chills, Fever, Night Sweats, Weight Gain and Weight Loss. Skin Present- Dryness. Not Present- Change in Wart/Mole, Hives, Jaundice, New Lesions, Non-Healing Wounds, Rash and Ulcer. HEENT Present- Seasonal Allergies, Sinus Pain and Wears glasses/contact lenses. Not Present- Earache, Hearing Loss, Hoarseness, Nose Bleed, Oral Ulcers, Ringing in the Ears, Sore Throat, Visual Disturbances and Yellow Eyes. Breast Not Present- Breast Mass, Breast Pain, Nipple Discharge and Skin Changes. Cardiovascular Present- Palpitations and Swelling of Extremities. Not Present- Chest Pain, Difficulty Breathing Lying Down, Leg Cramps, Rapid Heart Rate and Shortness of Breath. Gastrointestinal Present- Indigestion. Not Present- Abdominal Pain, Bloating, Bloody Stool, Change in Bowel Habits, Chronic diarrhea, Constipation, Difficulty Swallowing, Excessive gas, Gets full quickly at meals, Hemorrhoids, Nausea, Rectal Pain and Vomiting. Female Genitourinary Not Present- Frequency, Nocturia, Painful Urination, Pelvic  Pain and Urgency. Musculoskeletal Present- Joint Pain and Joint Stiffness. Not Present- Back Pain, Muscle Pain, Muscle Weakness and Swelling of Extremities. Psychiatric Present- Depression. Not Present- Anxiety, Bipolar, Change in Sleep Pattern, Fearful and Frequent crying. Endocrine Present- Hot flashes. Not Present- Cold Intolerance, Excessive Hunger, Hair Changes, Heat Intolerance and New Diabetes. Hematology Not Present- Easy Bruising, Excessive  bleeding, Gland problems, HIV and Persistent Infections.   Vitals (Sonya Bynum CMA; 04/25/2014 10:57 AM) 04/25/2014 10:56 AM Weight: 272 lb Height: 61in Body Surface Area: 2.3 m Body Mass Index: 51.39 kg/m Temp.: 97.88F(Temporal)  Pulse: 79 (Regular)  BP: 126/80 (Sitting, Left Arm, Standard)    Assessment & Plan Diana Hollingshead MD; 04/25/2014 11:10 AM) MALIGNANT NEOPLASM OF SIGMOID COLON (153.3  C18.7) Impression: Stage I status post sigmoid colectomy July 05, 2013. No clinical evidence of recurrence.  Plan: Check CEA level. Refer back to Dr. Amedeo Plenty for follow-up colonoscopy this spring. Return visit in 6 months. Current Plans  Follow up in 6 months or as needed CARCINOEMBRYONIC ANTIGEN (CEA) (20233) Referred to Gastroenterology, for evaluation and follow up Physiological scientist). Free Text Instructions : discussed with patient and provided information.  Jackolyn Confer, MD

## 2014-05-02 ENCOUNTER — Encounter: Payer: Self-pay | Admitting: *Deleted

## 2014-05-06 ENCOUNTER — Encounter: Payer: Self-pay | Admitting: Cardiology

## 2014-05-13 ENCOUNTER — Encounter: Payer: Self-pay | Admitting: Cardiology

## 2014-05-13 ENCOUNTER — Ambulatory Visit (INDEPENDENT_AMBULATORY_CARE_PROVIDER_SITE_OTHER): Payer: BLUE CROSS/BLUE SHIELD | Admitting: Cardiology

## 2014-05-13 VITALS — BP 154/88 | HR 68 | Ht 62.0 in | Wt 272.7 lb

## 2014-05-13 DIAGNOSIS — I471 Supraventricular tachycardia: Secondary | ICD-10-CM

## 2014-05-13 DIAGNOSIS — Z6841 Body Mass Index (BMI) 40.0 and over, adult: Secondary | ICD-10-CM

## 2014-05-13 DIAGNOSIS — E785 Hyperlipidemia, unspecified: Secondary | ICD-10-CM

## 2014-05-13 DIAGNOSIS — I10 Essential (primary) hypertension: Secondary | ICD-10-CM

## 2014-05-13 DIAGNOSIS — R931 Abnormal findings on diagnostic imaging of heart and coronary circulation: Secondary | ICD-10-CM

## 2014-05-13 DIAGNOSIS — R002 Palpitations: Secondary | ICD-10-CM

## 2014-05-13 DIAGNOSIS — E8881 Metabolic syndrome: Secondary | ICD-10-CM

## 2014-05-13 MED ORDER — LOSARTAN POTASSIUM-HCTZ 100-25 MG PO TABS: 1.0000 | ORAL_TABLET | Freq: Every day | ORAL | Status: DC

## 2014-05-13 NOTE — Patient Instructions (Signed)
INCREASE HYZAAR TO 100/25 MG DAILY  (LOSARTAN - HCTZ)  MAY USE A EXTRA 1/2 DOSE OF METOPROLOL IF PROLONG HEART RATE OCCURS.   Your physician wants you to follow-up in 39 MONTH Dr Ellyn Hack.  You will receive a reminder letter in the mail two months in advance. If you don't receive a letter, please call our office to schedule the follow-up appointment.

## 2014-05-14 ENCOUNTER — Other Ambulatory Visit: Payer: Self-pay | Admitting: Gastroenterology

## 2014-05-15 ENCOUNTER — Encounter: Payer: Self-pay | Admitting: Cardiology

## 2014-05-15 NOTE — Assessment & Plan Note (Signed)
We will follow beta blocker. She does have some palpitations that are probably PACs or PVCs since they're short lived. I talked about the potential of vagal maneuvers and even using a when necessary dose of 25 mg metoprolol for her prolonged episodes. Overall better controlled with increased dose of beta blocker 50 mg twice a day.

## 2014-05-15 NOTE — Progress Notes (Signed)
PCP: Geoffery Lyons, MD  Clinic Note: Chief Complaint  Patient presents with  . 6 month visit    no chest pain , some sob if walk fast,edema- schedule for a colonoscopy for toorrow   HPI: Diana Davidson is a 64 y.o. female with a Cardiovascular Problem List below who presents today for six-month followup of history of PSVT.  I last saw her in September of 2015 for post hospital followup after her laparoscopic partial colectomy. She actually had a cardiac catheterization to evaluate "intermediate risk "nuclear stress test done for preoperative evaluation showed mild nonobstructive CAD, indicating that it was a false positive stress test. She went through surgery without any major complications from cardiac standpoint. Her postop course was somewhat complicated by wound dehiscence requiring Wound VAC therapy for couple weeks.  As result of her surgery, she really has not been very mobile. She's consistently put on more weight.  She is hoping to start back up with "Weight Watchers "and hopes to get back into the gym now she is more recovered from her surgery.  Interval History: she really is doing very well from a cardiac standpoint.. She says her palpitations are pretty rare. She says sometimes her heart rate will go to the 110 range, but does not feel overly symptomatic. The episodes don't last more than 2 minutes, and she has not had any syncope or near syncopal type symptoms she denies any chest pressure or significant dyspnea with her routine activities.  She does note that her legs hurt with walking but is more her joints and muscles.she may have some swelling in her legs she is salty food.  Cardiovascular ROS: no chest pain or dyspnea on exertion positive for - irregular heartbeat, palpitations and rapid heart rate negative for - chest pain, edema, loss of consciousness, murmur, orthopnea, paroxysmal nocturnal dyspnea, shortness of breath or Near syncope, TIA/amaurosis fugax.:  Past  Medical History  Diagnosis Date  . Osteopenia   . PSVT (paroxysmal supraventricular tachycardia) 02/2011 and 03/2011    Wore a MONITOR=brief run of psvt that are very short, as well a frequent PACs and sinus tachycardia   . Heart palpitations     PSVT, PVCs, PACS - all short bursts, no syncope  . Hypertension   . Morbid obesity with BMI of 45.0-49.9, adult   . Arthritis   . GERD (gastroesophageal reflux disease)   . Hyperlipidemia   . Neuromuscular disorder   . Fibromyalgia   . Colon cancer   . Stress incontinence   . Anemia   . Diabetes mellitus without complication 03/7614    [id number]  - Borderline diabetes  . Anxiety     hx panic attacks - none for several years  . Abnormal nuclear stress test     False +; Cardiac cath with no obstructive CAD.    Prior Cardiac Evaluation and Past Surgical History: Past Surgical History  Procedure Laterality Date  . Doppler echocardiography  03/03/2011    EF greater than 55; Moderate concentric LVH, grade 1 1 diastolic dysfunction with mildly elevated LVEDP/LAP; RV pressure 30-40 mmHg;   . Nm myoview ltd  05/15/2013    INTERMEDIATE RISK - small area of inferior reversibility suggestive of possible ischemia. Cannot exclude bowel artifact. - normal cardiac cath   . Cardiac catheterization  05/2013    Non-obstructive Disease - False + Nuc ST   Current Outpatient Prescriptions on File Prior to Visit  Medication Sig Dispense Refill  . Calcium Citrate-Vitamin D (  CALCIUM CITRATE + D3 PO) Take 2 tablets by mouth daily.     . cetirizine (ZYRTEC) 10 MG tablet Take 10 mg by mouth daily.    . Cholecalciferol (VITAMIN D-3) 1000 UNITS CAPS Take 2,000 Units by mouth daily.     . Coenzyme Q10 (CO Q 10) 100 MG CAPS Take 100 mg by mouth daily.     . DULoxetine (CYMBALTA) 60 MG capsule Take 60 mg by mouth every morning.     Marland Kitchen esomeprazole (NEXIUM) 40 MG capsule Take 40 mg by mouth daily before breakfast.    . ibuprofen (ADVIL,MOTRIN) 200 MG tablet Take 400 mg  by mouth every 6 (six) hours as needed for moderate pain.    Marland Kitchen LORazepam (ATIVAN) 1 MG tablet Take 1 mg by mouth 3 (three) times daily.     . metoprolol (LOPRESSOR) 50 MG tablet Take 1 tablet (50 mg total) by mouth 2 (two) times daily. 60 tablet 11  . simvastatin (ZOCOR) 20 MG tablet Take 20 mg by mouth every evening.     No current facility-administered medications on file prior to visit.   Allergies  Allergen Reactions  . Dilaudid [Hydromorphone Hcl] Other (See Comments)    hallucination  . Novocain [Procaine]     Heart palpitations   . Prednisone Other (See Comments)    Red flush area across her face  . Sulfa Antibiotics Other (See Comments)    Sore in top of mouth  . Penicillins Rash   No Change in Social and Family History  ROS: A comprehensive Review of Systems - was performed Review of Systems  Constitutional:       Weight gain due to lack of exercise  HENT: Negative for nosebleeds and tinnitus.   Eyes: Negative for blurred vision and double vision.  Respiratory: Negative for cough and shortness of breath.   Cardiovascular: Positive for palpitations (Less frequent, short lived 1-2 minutes). Negative for claudication.  Gastrointestinal: Negative for blood in stool and melena.  Genitourinary: Negative for hematuria.  Musculoskeletal: Positive for back pain and joint pain.  Neurological: Negative for dizziness, sensory change, speech change, focal weakness, seizures, loss of consciousness and headaches.  Endo/Heme/Allergies: Negative.   Psychiatric/Behavioral: Negative.   All other systems reviewed and are negative.  Wt Readings from Last 3 Encounters:  05/13/14 272 lb 11.2 oz (123.696 kg)  11/12/13 253 lb 1.6 oz (114.805 kg)  08/15/13 245 lb 6.4 oz (111.313 kg)   PHYSICAL EXAM BP 154/88 mmHg  Pulse 68  Ht 5\' 2"  (1.575 m)  Wt 272 lb 11.2 oz (123.696 kg)  BMI 49.86 kg/m2 General appearance: alert, cooperative, appears stated age, no distress and morbidly obese  (notably more obese) Neck: no adenopathy, no carotid bruit and no JVD  Lungs: CTAPB, normal percussion bilaterally and non-labored  Heart: RRR, S1& S2 normal, no murmur, click, rub or gallop; unable to palpate PMI  Abdomen: soft, non-tender; bowel sounds normal; no masses, no organomegaly; significant truncal obesity  Extremities: extremities normal, atraumatic, no cyanosis, or edema  Pulses: 2+ and symmetric;  Neurologic: Mental status: Alert, oriented, thought content appropriate    Adult ECG Report  Rate: 68;  Rhythm: normal sinus rhythm, does not meet criteria for Inf MI age undetermined ; no significant change   Recent Labs -- none available:   ASSESSMENT / PLAN: Essential hypertension Not adequately controlled. Will increase Hyzaar to 100/25. Continue current dose of beta blocker. Her blood pressure will allow for when necessary beta blocker for SVT  symptoms   PSVT (paroxysmal supraventricular tachycardia) - history of We will follow beta blocker. She does have some palpitations that are probably PACs or PVCs since they're short lived. I talked about the potential of vagal maneuvers and even using a when necessary dose of 25 mg metoprolol for her prolonged episodes. Overall better controlled with increased dose of beta blocker 50 mg twice a day.   Metabolic syndrome: Obesity, hypertension, diabetes Unfortunately, she has gained weight instead of losing weight. This she'll to go to [employer] and get back into working out in the gym. She has not yet gotten her somewhat motivated. I strongly admonished her to start working on weight loss as this makes her metabolic syndrome more concerning.   Morbid obesity with BMI of 45.0-49.9, adult The patient understands the need to lose weight with diet and exercise. We have discussed specific strategies for this.     Orders Placed This Encounter  Procedures  . EKG 12-Lead   Meds ordered this encounter  Medications  .  acetaminophen (TYLENOL) 500 MG tablet    Sig: Take 500 mg by mouth every 6 (six) hours as needed for moderate pain.  Marland Kitchen losartan-hydrochlorothiazide (HYZAAR) 100-25 MG per tablet    Sig: Take 1 tablet by mouth daily.    Dispense:  30 tablet    Refill:  11    Followup: 12 months  DAVID W. Ellyn Hack, M.D., M.S. Interventional Cardiologist [facility]

## 2014-05-15 NOTE — Assessment & Plan Note (Signed)
The patient understands the need to lose weight with diet and exercise. We have discussed specific strategies for this.  

## 2014-05-15 NOTE — Assessment & Plan Note (Signed)
Not adequately controlled. Will increase Hyzaar to 100/25. Continue current dose of beta blocker. Her blood pressure will allow for when necessary beta blocker for SVT symptoms

## 2014-05-15 NOTE — Assessment & Plan Note (Signed)
Unfortunately, she has gained weight instead of losing weight. This she'll to go to [employer] and get back into working out in the gym. She has not yet gotten her somewhat motivated. I strongly admonished her to start working on weight loss as this makes her metabolic syndrome more concerning.

## 2014-09-02 ENCOUNTER — Other Ambulatory Visit: Payer: Self-pay | Admitting: Obstetrics & Gynecology

## 2014-09-03 LAB — CYTOLOGY - PAP

## 2014-10-13 ENCOUNTER — Other Ambulatory Visit: Payer: Self-pay | Admitting: Cardiology

## 2014-10-14 NOTE — Telephone Encounter (Signed)
REFILL 

## 2014-12-08 IMAGING — CT CT ABD-PELV W/ CM
3 of 5 series · 15 of 32 positions shown, 19 images · IV contrast (READICAT/WATER & [ID] OMNI 300)
Comparison: None

CLINICAL DATA: New diagnosis of colon cancer.  Microscopic blood.

EXAM:
CT ABDOMEN AND PELVIS WITH CONTRAST
TECHNIQUE: Multidetector CT imaging of the abdomen and pelvis was performed
using the standard protocol following bolus administration of
intravenous contrast.
CONTRAST:  30mL OMNIPAQUE IOHEXOL 300 MG/ML SOLN, 125mL OMNIPAQUE
IOHEXOL 300 MG/ML SOLN
BUN and creatinine were obtained on site at [HOSPITAL] at
[HOSPITAL].
Results:  BUN 14 mg/dL,  Creatinine 0.8 mg/dL.

[Series 2: abd/pelvis w/ · axial · 0.98mm/px · z∈[-352,-132]mm · 3 of 88 slices shown, 7 images]
[im 22/88  soft-tissue]
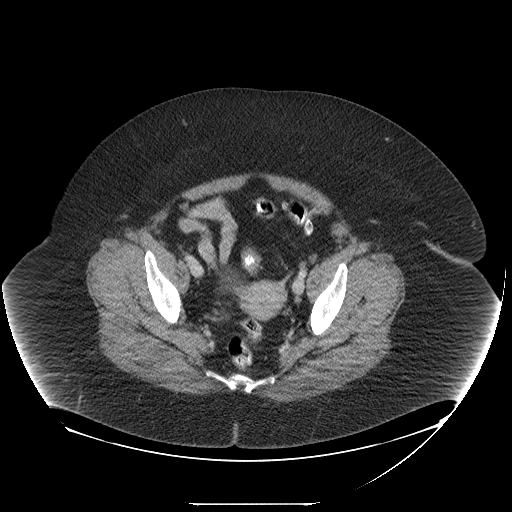
[im 22/88  lung]
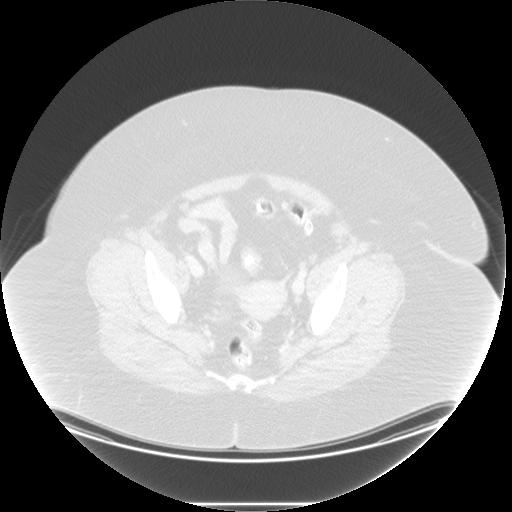
[im 22/88  bone]
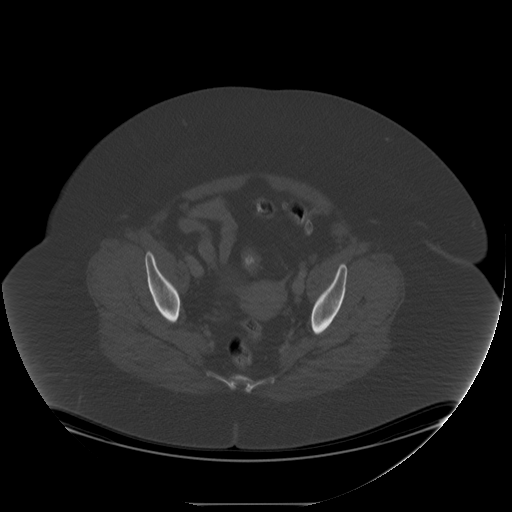
[im 44/88  soft-tissue]
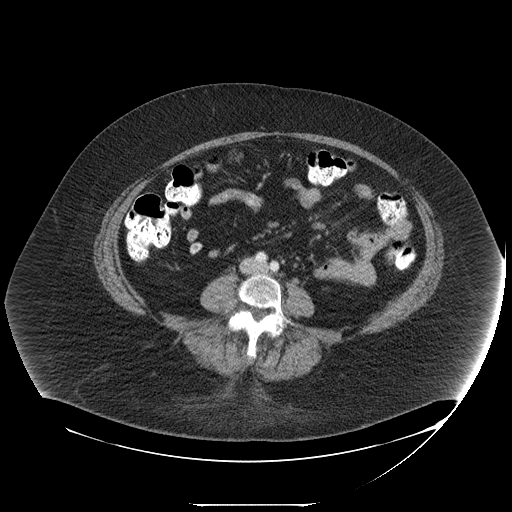
[im 44/88  lung]
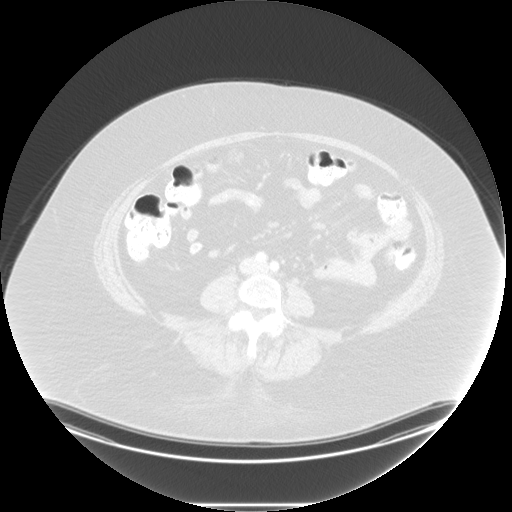
[im 66/88  soft-tissue]
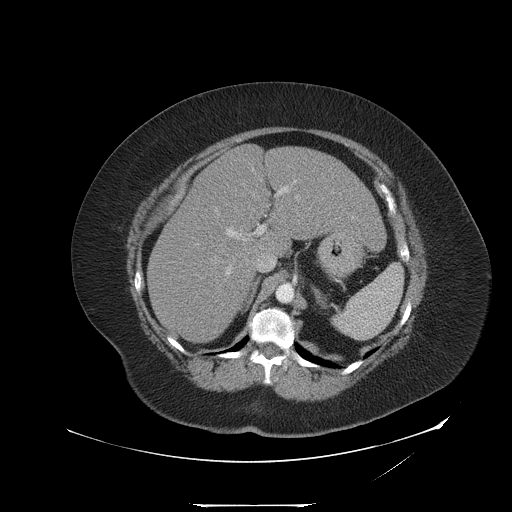
[im 66/88  lung]
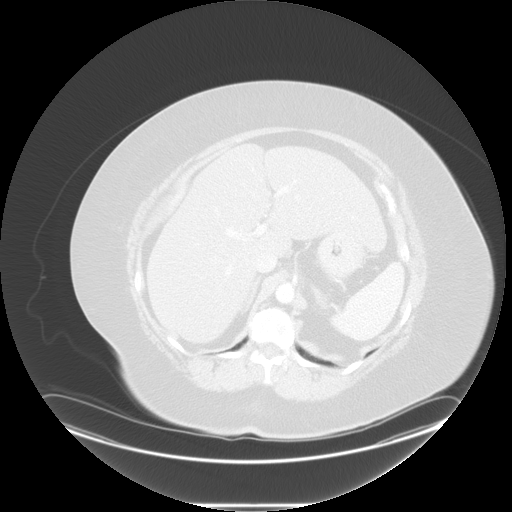

[Series 400: sag · sagittal · 0.98mm/px · 8 of 196 slices shown]
[im 17/196  soft-tissue]
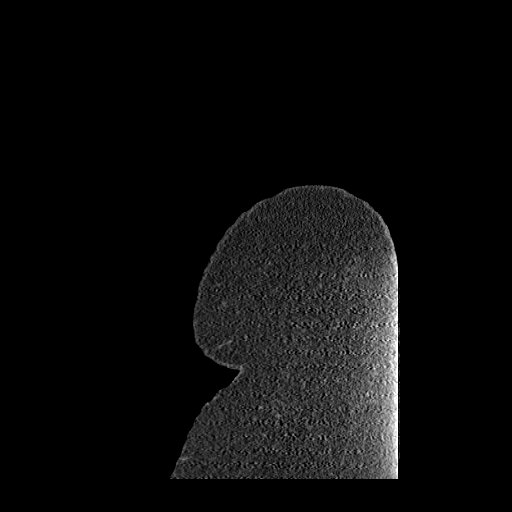
[im 49/196  soft-tissue]
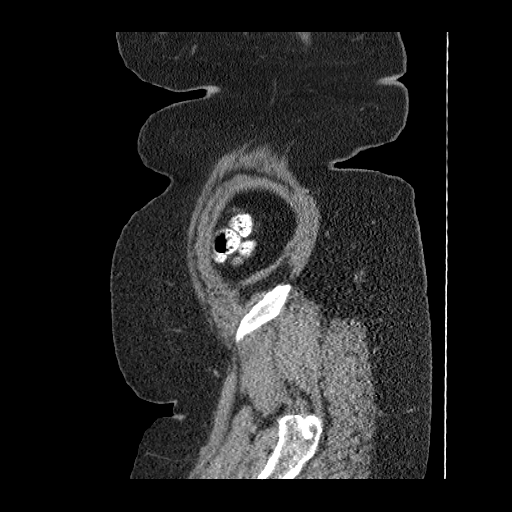
[im 66/196  soft-tissue]
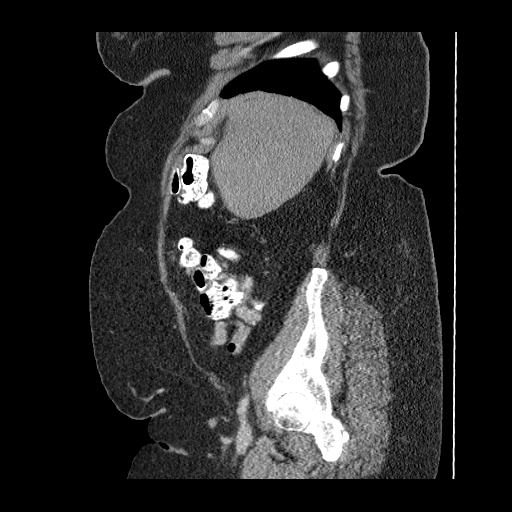
[im 82/196  soft-tissue]
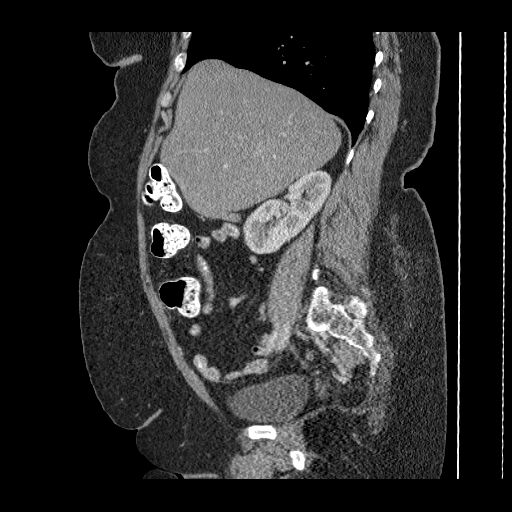
[im 114/196  soft-tissue]
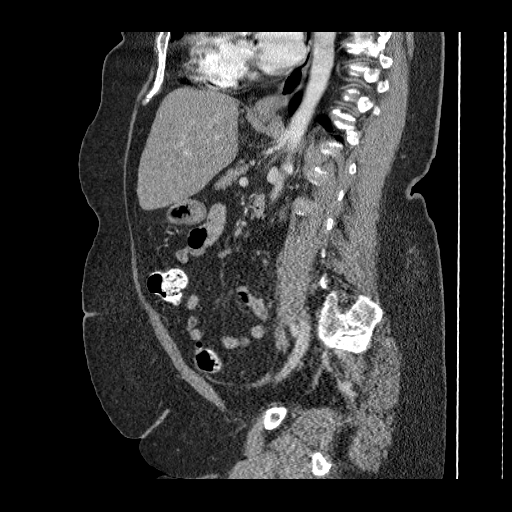
[im 131/196  soft-tissue]
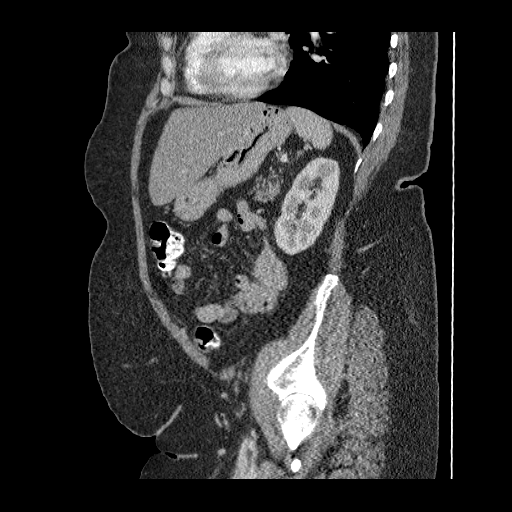
[im 147/196  soft-tissue]
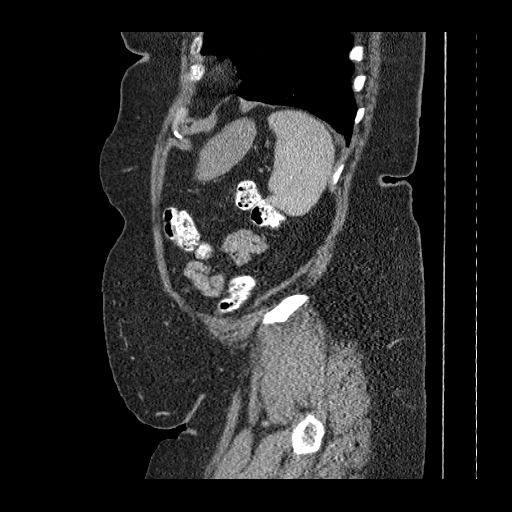
[im 179/196  soft-tissue]
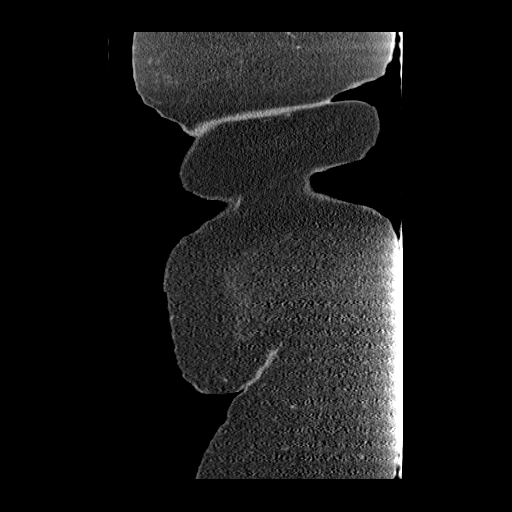

[Series 401: cor · coronal · 0.98mm/px · 4 of 144 slices shown]
[im 16/144  soft-tissue]
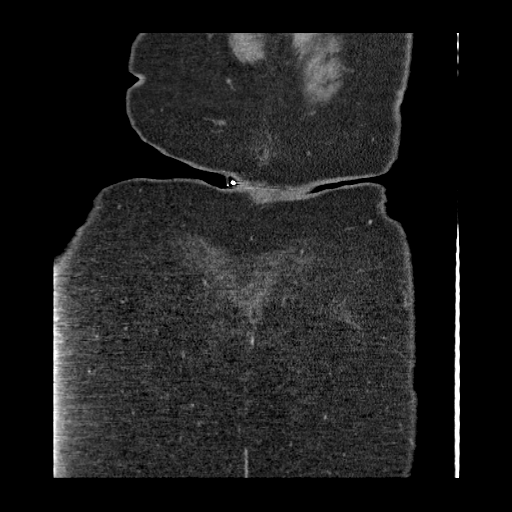
[im 32/144  soft-tissue]
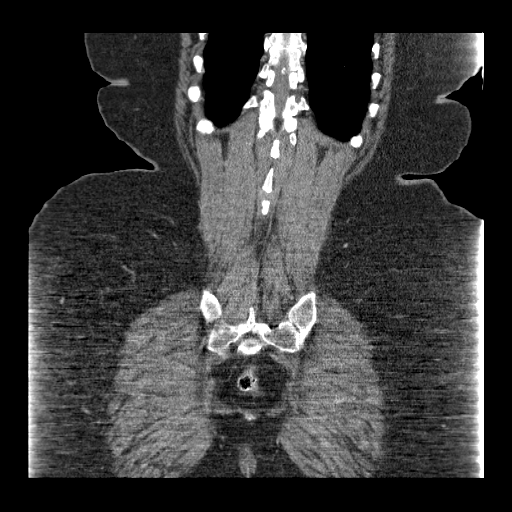
[im 48/144  soft-tissue]
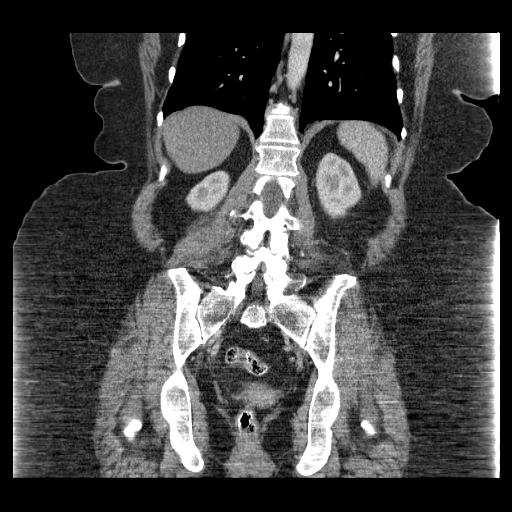
[im 64/144  soft-tissue]
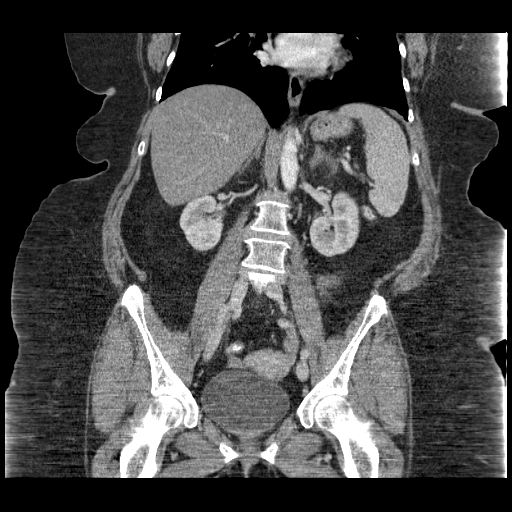

[15 of 32 positions shown; findings below may reference images not displayed]

FINDINGS: The lung bases appear clear.  No pleural or pericardial effusion.

Mild diffuse low attenuation within the liver parenchyma is noted
compatible with fatty infiltration. The gallbladder appears normal.
There is no biliary dilatation. The pancreas appears normal. The
spleen is normal.

The adrenal glands are both unremarkable. Normal appearance of both
kidneys. The urinary bladder appears normal. The uterus and the
adnexal structures are both unremarkable.

Normal caliber of the abdominal aorta. There is no aneurysm. No
upper abdominal adenopathy identified. There is no pelvic or
inguinal adenopathy. No pelvic or inguinal adenopathy identified.
There is no free fluid or fluid collections within the abdomen or
pelvis.

The stomach is normal. The small bowel loops have a normal course
and caliber and there is no evidence for obstruction. The appendix
is visualized and appears normal. The proximal colon is normal.
There are multiple distal colonic diverticula without acute
inflammation.

Review of the visualized bony structures is significant for mild
multi level lumbar spondylosis. This is most severe at the L4-5
level.
IMPRESSION: 1. No evidence for metastatic disease within the abdomen or pelvis.
2. Hepatic steatosis.
3. Sigmoid diverticula without acute inflammation.

## 2015-01-30 IMAGING — CR DG CHEST 2V
2 series · 2 of 2 positions shown · non-contrast
Comparison: None

CLINICAL DATA: Carcinoma of the colon, hypertension, diabetes

EXAM:
CHEST  2 VIEW

[w chest pa]
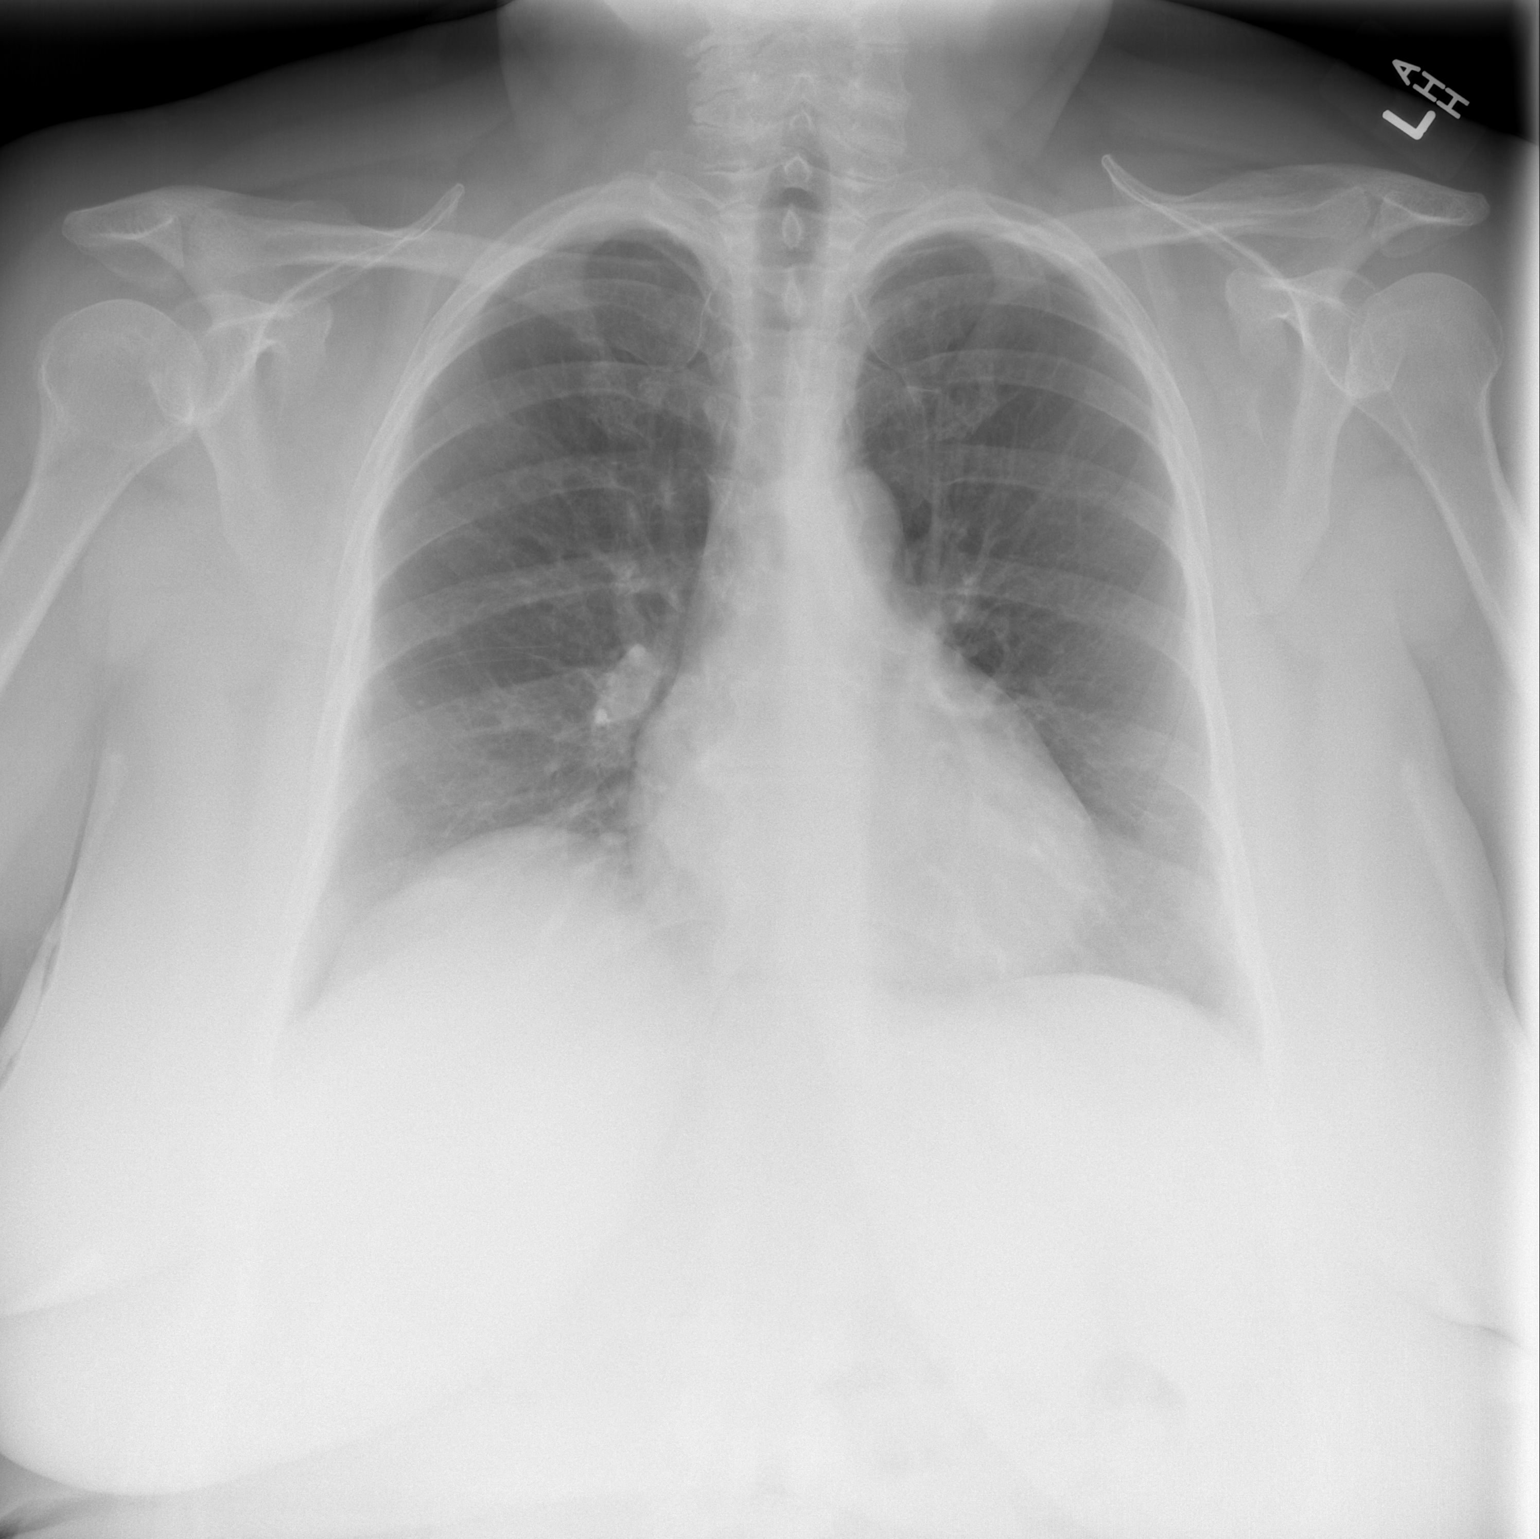

[w chest lat *]
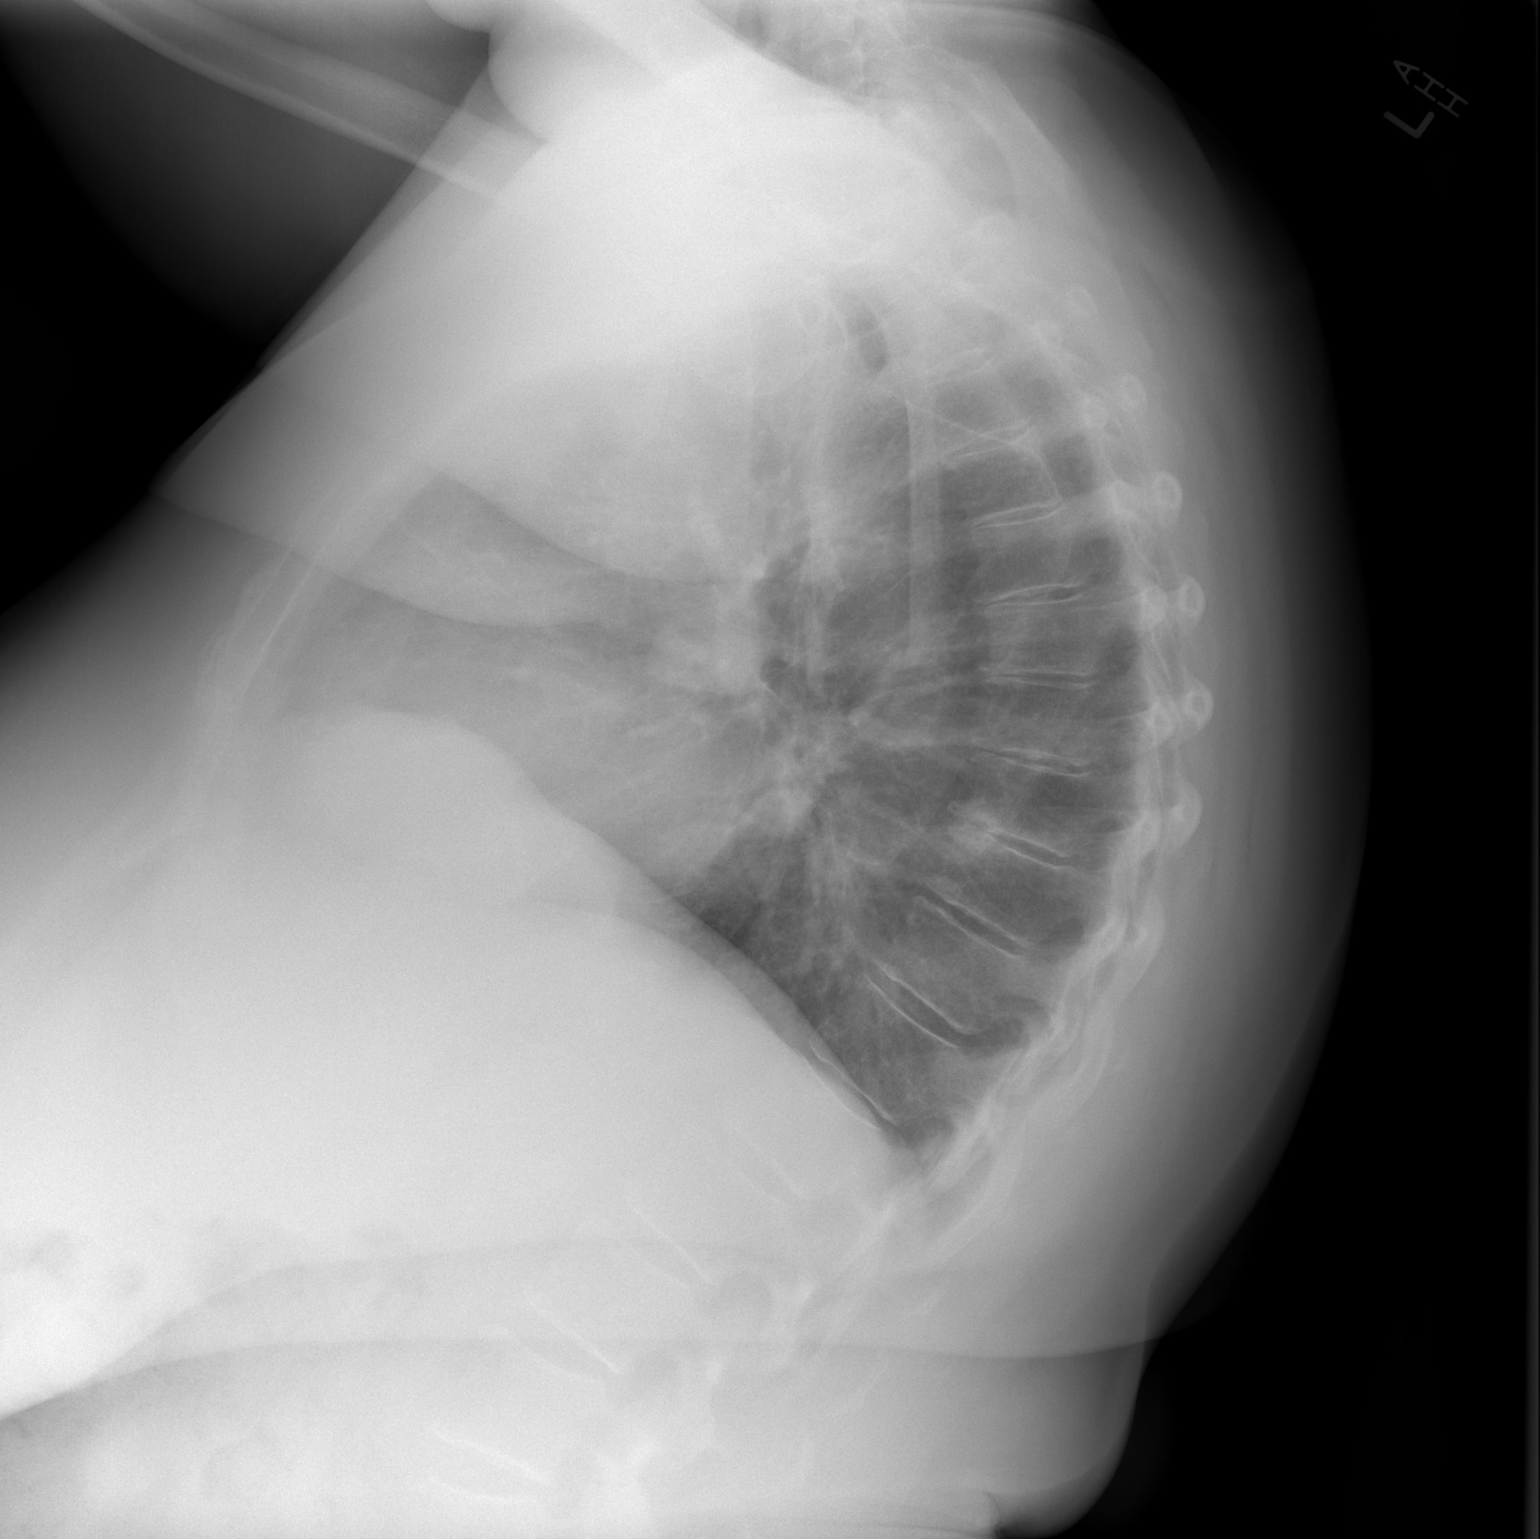

[2 of 2 positions shown; findings below may reference images not displayed]

FINDINGS: Upper normal heart size.

Mediastinal contours and pulmonary vascularity normal.

Lungs clear.

No pleural effusion or pneumothorax.

Bones demineralized.
IMPRESSION: No acute abnormalities.

## 2015-05-15 ENCOUNTER — Ambulatory Visit (INDEPENDENT_AMBULATORY_CARE_PROVIDER_SITE_OTHER): Payer: BLUE CROSS/BLUE SHIELD | Admitting: Cardiology

## 2015-05-15 VITALS — BP 138/88 | HR 81 | Ht 62.0 in | Wt 269.0 lb

## 2015-05-15 DIAGNOSIS — E785 Hyperlipidemia, unspecified: Secondary | ICD-10-CM

## 2015-05-15 DIAGNOSIS — Z6841 Body Mass Index (BMI) 40.0 and over, adult: Secondary | ICD-10-CM

## 2015-05-15 DIAGNOSIS — I471 Supraventricular tachycardia: Secondary | ICD-10-CM

## 2015-05-15 DIAGNOSIS — E8881 Metabolic syndrome: Secondary | ICD-10-CM

## 2015-05-15 DIAGNOSIS — R0600 Dyspnea, unspecified: Secondary | ICD-10-CM

## 2015-05-15 DIAGNOSIS — I10 Essential (primary) hypertension: Secondary | ICD-10-CM | POA: Diagnosis not present

## 2015-05-15 DIAGNOSIS — R0609 Other forms of dyspnea: Secondary | ICD-10-CM

## 2015-05-15 NOTE — Progress Notes (Signed)
PCP: Geoffery Lyons, MD  Clinic Note: Chief Complaint  Patient presents with  . Annual Exam    YEARLY  . Chest Pain    C/O NONE  . Shortness of Breath    WITH EXERTION AFTER BEING ACTIVE  . Edema    WHILE SITTING FOR LONG PERIODS    HPI: Diana Davidson is a 65 y.o. female with a PMH below who presents today for annual f/u for PSVT. Cath for Intermediate Risk ST - non-obstructive CAD in Sept 2015.  Diana Davidson was last seen on Nov 13, 2014.  She was doing well.  Rare episodes of palpitations.  Recent Hospitalizations: Studies Reviewed: NONE; recent colonoscopy was read as negative. Due to follow-up in 3 years.  Interval History: Diana Davidson presents today with no significant Cardiac complaints.   She still has short episodes of frequent palpitations, but rarely (~1/37months).   She is not very active from a exercise/exertion standpoint, but with what she is able to, she denies any chest tightness or pressure. She does getting dyspneic when she overexerts. She only notes edema when she is standing or sitting for longer time. No PND, orthopnea.  No lightheadedness, dizziness, weakness or syncope/near syncope. No TIA/amaurosis fugax symptoms. No claudication.  ROS: A comprehensive was performed. Review of Systems  Constitutional:       Labile weight - goes up & down.  Cannot seem to keep it off.  Down some from last year.   HENT: Negative for congestion and nosebleeds.   Respiratory: Positive for shortness of breath. Negative for cough and wheezing.   Cardiovascular: Positive for palpitations and leg swelling.       Per HPI  Musculoskeletal: Positive for joint pain. Negative for myalgias and falls.  Neurological: Negative for dizziness and headaches.  Endo/Heme/Allergies: Does not bruise/bleed easily.  All other systems reviewed and are negative.   Past Medical History  Diagnosis Date  . Osteopenia   . PSVT (paroxysmal supraventricular tachycardia) ([place]) 02/2011 and 03/2011      Wore a MONITOR=brief run of psvt that are very short, as well a frequent PACs and sinus tachycardia   . Heart palpitations     PSVT, PVCs, PACS - all short bursts, no syncope  . Hypertension   . Morbid obesity with BMI of 45.0-49.9, adult ([place])   . Arthritis   . GERD (gastroesophageal reflux disease)   . Hyperlipidemia   . Neuromuscular disorder ([place])   . Fibromyalgia   . Colon cancer [facility])     April 2015: LAP Sigmoidectomy (Dr. Zella Richer)   . Stress incontinence   . Anemia   . Diabetes mellitus without complication ([place]) [id number]    HgbA1c 6  - Borderline diabetes  . Anxiety     hx panic attacks - none for several years  . Abnormal nuclear stress test     False +; Cardiac cath with no obstructive CAD.    Past Surgical History  Procedure Laterality Date  . Nm pet img alzheimers    . Doppler echocardiography  03/03/2011    EF greater than 55; Moderate concentric LVH, grade 1 1 diastolic dysfunction with mildly elevated LVEDP/LAP; RV pressure 30-40 mmHg;   . Nm myoview ltd  05/15/2013    INTERMEDIATE RISK - small area of inferior reversibility suggestive of possible ischemia. Cannot exclude bowel artifact.  . Shoulder surgery    . Bunionectomy      LEFT  . Laparoscopic partial colectomy N/A 07/05/2013    Procedure:  LAPAROSCOPIC ASSISTED SIGMOID COLECTOMY AND PROCTOSCOPY;  Surgeon: Odis Hollingshead, MD;  Location: [facility] ORS;  Service: General;  Laterality: N/A;  . Cardiac catheterization  05/2013    Non-obstructive Disease - False + Nuc ST  . Left heart catheterization with coronary angiogram N/A 05/22/2013    Procedure: LEFT HEART CATHETERIZATION WITH CORONARY ANGIOGRAM;  Surgeon: Leonie Man, MD;  Location: [facility] CATH LAB;  Service: Cardiovascular;  Laterality: N/A;   Prior to Admission medications   Medication Sig Start Date End Date Taking? Authorizing Provider  acetaminophen (TYLENOL) 500 MG tablet Take 500 mg by mouth every 6 (six) hours as needed for moderate pain.   Yes  Historical Provider, MD  Calcium Citrate-Vitamin D (CALCIUM CITRATE + D3 PO) Take 2 tablets by mouth daily.    Yes Historical Provider, MD  Cholecalciferol (VITAMIN D-3) 1000 UNITS CAPS Take 2,000 Units by mouth daily.    Yes Historical Provider, MD  Coenzyme Q10 (CO Q 10) 100 MG CAPS Take 100 mg by mouth daily.    Yes Historical Provider, MD  DULoxetine (CYMBALTA) 60 MG capsule Take 60 mg by mouth every morning.    Yes Historical Provider, MD  esomeprazole (NEXIUM) 40 MG capsule Take 40 mg by mouth daily before breakfast.   Yes Historical Provider, MD  ibuprofen (ADVIL,MOTRIN) 200 MG tablet Take 400 mg by mouth every 6 (six) hours as needed for moderate pain.   Yes Historical Provider, MD  LORazepam (ATIVAN) 1 MG tablet Take 1 mg by mouth 3 (three) times daily.  09/17/12  Yes Historical Provider, MD  losartan-hydrochlorothiazide (HYZAAR) 100-25 MG per tablet Take 1 tablet by mouth daily. 05/13/14  Yes Leonie Man, MD  metoprolol (LOPRESSOR) 50 MG tablet TAKE 1 TABLET (50 MG TOTAL) BY MOUTH 2 (TWO) TIMES DAILY. 10/14/14  Yes Leonie Man, MD  simvastatin (ZOCOR) 20 MG tablet Take 20 mg by mouth every evening.   Yes Historical Provider, MD   Allergies  Allergen Reactions  . Dilaudid [Hydromorphone Hcl] Other (See Comments)    hallucination  . Novocain [Procaine]     Heart palpitations   . Prednisone Other (See Comments)    Red flush area across her face  . Sulfa Antibiotics Other (See Comments)    Sore in top of mouth  . Penicillins Rash    Social History   Social History  . Marital Status: Single    Spouse Name: N/A  . Number of Children: N/A  . Years of Education: N/A   Social History Main Topics  . Smoking status: Never Smoker   . Smokeless tobacco: None  . Alcohol Use: No  . Drug Use: No  . Sexual Activity: Not Asked   Other Topics Concern  . None   Social History Narrative   She is a single woman. 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 She does walk, walking her   dog,  but has been bothered recently by her knee. She does not smoke, does not drink. She walks her dog about 4-5 days a week and does this for about 30 minutes a day.    Family History  Problem Relation Age of Onset  . Diabetes Mother   . Hypertension Mother   . Alzheimer's disease Mother   . Cancer Paternal Aunt     ovarian  . Hypertension Maternal Grandmother   . Hypertension Maternal Grandfather   . Stroke Maternal Grandfather   . Stroke Paternal Grandmother      Wt  Readings from Last 3 Encounters:  05/15/15 269 lb (122.018 kg)  05/13/14 272 lb 11.2 oz (123.696 kg)  11/12/13 253 lb 1.6 oz (114.805 kg)    PHYSICAL EXAM BP 138/88 mmHg  Pulse 81  Ht 5\' 2"  (1.575 m)  Wt 269 lb (122.018 kg)  BMI 49.19 kg/m2 General appearance: alert, cooperative, appears stated age, no distress and morbidly obese  HEENT: [place]/AT, EOMI, MMM, anicteric sclera Neck: no adenopathy, no carotid bruit and no JVD  Lungs: clear to auscultation bilaterally, normal percussion bilaterally and non-labored  Heart: regular rate and rhythm, S1, S2 normal, no murmur, click, rub or gallop; unable to palpate PMI  Abdomen: soft, non-tender; bowel sounds normal; no masses, no organomegaly; significant truncal obesity  Extremities: extremities normal, atraumatic, no cyanosis, or edema  Pulses: 2+ and symmetric;  Neurologic: Mental status: Alert, oriented, thought content appropriate  Cranial nerves: normal (II-XII grossly intact)    Adult ECG Report  Rate: 81 ;  Rhythm: normal sinus rhythm and indeterminate;   Narrative Interpretation: Inferior Q waves with T-wave inversions that would suggest history of inferior infarct, age undetermined, there are also subtle Q waves in V3 to V6. (No notable CAD on angiography)   Other studies Reviewed: Additional studies/ records that were reviewed today include:  Recent Labs:  Followed by PCP, No labs available. No results found for: CHOL, HDL, LDLCALC, LDLDIRECT, TRIG,  CHOLHDL    ASSESSMENT / PLAN: Problem List Items Addressed This Visit    PSVT (paroxysmal supraventricular tachycardia) - history of (Chronic)    Doing well with current dose of beta blocker has not had any significant breakthroughs of PSVT. She has some rare palpitations which are probably just ectopy as her not very long lasting. Nothing more than a couple minutes. We readdressed the potential use of vagal maneuvers as well as when necessary additional half dose of Lopressor. Overall her symptoms have improved since he went up to 50 twice a day of Lopressor      Morbid obesity with BMI of 45.0-49.9, adult ([place]) (Chronic)    The patient understands the need to lose weight with diet and exercise. We have discussed specific strategies for this.      Metabolic syndrome: Obesity, hypertension, diabetes (Chronic)   Exertional dyspnea (Chronic)    Negative ischemic evaluation. Therefore most likely related to obesity & deconditioning      Relevant Orders   EKG 12-Lead   Essential hypertension (Chronic)    Borderline control today.  She is now on full dose Hyzaar. Could potentially increase metoprolol/Lopressor, however as she is actually still not above goal, we will maintain on current dosing.      Relevant Orders   EKG 12-Lead   Dyslipidemia, goal LDL below 130 but closer to 100 - Primary (Chronic)    On statin. Followed by PCP      Relevant Orders   EKG 12-Lead      Current medicines are reviewed at length with the patient today. (+/- concerns) none The following changes have been made: none NO CHANGE  IN CURRENT MEDICATIONS  Your physician wants you to follow-up in [place].    Studies Ordered:   Orders Placed This Encounter  Procedures  . EKG 12-Lead      Leonie Man, M.D., M.S. Interventional Cardiologist   Pager # [phone] Phone # [phone] [address] [place], [place] [address]

## 2015-05-15 NOTE — Patient Instructions (Signed)
NO CHANGE IN CURRENT MEDICATIONS  Your physician wants you to follow-up in 12 MONTHS WITH DR HARDING.  You will receive a reminder letter in the mail two months in advance. If you don't receive a letter, please call our office to schedule the follow-up appointment.  If you need a refill on your cardiac medications before your next appointment, please call your pharmacy.   

## 2015-05-17 ENCOUNTER — Encounter: Payer: Self-pay | Admitting: Cardiology

## 2015-05-17 NOTE — Assessment & Plan Note (Signed)
Doing well with current dose of beta blocker has not had any significant breakthroughs of PSVT. She has some rare palpitations which are probably just ectopy as her not very long lasting. Nothing more than a couple minutes. We readdressed the potential use of vagal maneuvers as well as when necessary additional half dose of Lopressor. Overall her symptoms have improved since he went up to 50 twice a day of Lopressor

## 2015-05-17 NOTE — Assessment & Plan Note (Signed)
The patient understands the need to lose weight with diet and exercise. We have discussed specific strategies for this.  

## 2015-05-17 NOTE — Assessment & Plan Note (Signed)
Negative ischemic evaluation. Therefore most likely related to obesity & deconditioning

## 2015-05-17 NOTE — Assessment & Plan Note (Signed)
Borderline control today.  She is now on full dose Hyzaar. Could potentially increase metoprolol/Lopressor, however as she is actually still not above goal, we will maintain on current dosing.

## 2015-05-17 NOTE — Assessment & Plan Note (Signed)
On statin. Followed by PCP

## 2015-07-14 ENCOUNTER — Other Ambulatory Visit: Payer: Self-pay | Admitting: Cardiology

## 2015-07-14 NOTE — Telephone Encounter (Signed)
Rx refill sent to pharmacy. 

## 2016-05-14 ENCOUNTER — Ambulatory Visit (INDEPENDENT_AMBULATORY_CARE_PROVIDER_SITE_OTHER): Payer: BLUE CROSS/BLUE SHIELD | Admitting: Cardiology

## 2016-05-14 ENCOUNTER — Encounter: Payer: Self-pay | Admitting: Cardiology

## 2016-05-14 VITALS — BP 140/90 | HR 71 | Ht 61.0 in | Wt 257.0 lb

## 2016-05-14 DIAGNOSIS — I10 Essential (primary) hypertension: Secondary | ICD-10-CM | POA: Diagnosis not present

## 2016-05-14 DIAGNOSIS — E8881 Metabolic syndrome: Secondary | ICD-10-CM

## 2016-05-14 DIAGNOSIS — R0600 Dyspnea, unspecified: Secondary | ICD-10-CM

## 2016-05-14 DIAGNOSIS — R002 Palpitations: Secondary | ICD-10-CM | POA: Diagnosis not present

## 2016-05-14 DIAGNOSIS — E785 Hyperlipidemia, unspecified: Secondary | ICD-10-CM

## 2016-05-14 DIAGNOSIS — I471 Supraventricular tachycardia: Secondary | ICD-10-CM | POA: Diagnosis not present

## 2016-05-14 DIAGNOSIS — Z6841 Body Mass Index (BMI) 40.0 and over, adult: Secondary | ICD-10-CM

## 2016-05-14 DIAGNOSIS — R0609 Other forms of dyspnea: Secondary | ICD-10-CM

## 2016-05-14 NOTE — Progress Notes (Signed)
PCP: Geoffery Lyons, MD  Clinic Note: Chief Complaint  Patient presents with  . Follow-up    1 year - h/o PSVT  . Edema    Numbness    HPI: Diana Davidson is a 66 y.o. female with a PMH below who presents today for Annual follow-up for PSVT. She has intermediate risk rest test back in 2015 and had a cardiac cath showing nonobstructive CAD.  Diana Davidson was last seen on 05/15/2015. She was doing well at that time. She had short spells of palpitations but nothing significant.  Recent Hospitalizations: n/a  Studies Reviewed: none  Interval History: Diana Davidson presents today doing quite well. She says every once in a while she may feel her heart rate going up for a minute or 2, but nothing sustained and nothing that makes her feel lightheaded or dizzy. She has tried some vagal maneuvers that have been successful. However really since she has been on her current her Lopressor dose,she really hasn't noticed that much the way of any recurrent symptoms. She isn't very active, but does try to do some activity. No routine exercise. She denies any resting or exertional chest tightness/pressure or dyspnea. She does note some numbness in her fingers and toes and some insomnia but otherwise has been asymptomatic overall. She is remained healthy without any recurrent illnesses.  Remaining cardiovascular review of symptoms:  No PND, orthopnea or edema.  No lightheadedness, dizziness, weakness or syncope/near syncope. No TIA/amaurosis fugax symptoms. No claudication.  ROS: A comprehensive was performed. Review of Systems  Constitutional: Negative for malaise/fatigue (Just lack of motivation to get exercise.) and weight loss.  HENT: Negative for nosebleeds.   Eyes: Negative for blurred vision.  Cardiovascular: Positive for palpitations. Negative for leg swelling.       Per history of present illness  Gastrointestinal: Negative for blood in stool and melena.  Genitourinary: Negative for hematuria.   Musculoskeletal: Positive for joint pain (Arthritis pains).  Neurological: Negative for dizziness and headaches.  Psychiatric/Behavioral: Negative for memory loss. The patient has insomnia.     Past Medical History:  Diagnosis Date  . Abnormal nuclear stress test    False +; Cardiac cath with no obstructive CAD.  Marland Kitchen Anemia   . Anxiety    hx panic attacks - none for several years  . Arthritis   . Colon cancer [facility])    April 2015: LAP Sigmoidectomy (Dr. Zella Richer)   . Diabetes mellitus without complication ([place]) 123456   HgbA1c 6  - Borderline diabetes  . Fibromyalgia   . GERD (gastroesophageal reflux disease)   . Heart palpitations    PSVT, PVCs, PACS - all short bursts, no syncope  . Hyperlipidemia   . Hypertension   . Morbid obesity with BMI of 45.0-49.9, adult ([place])   . Neuromuscular disorder ([place])   . Osteopenia   . PSVT (paroxysmal supraventricular tachycardia) ([facility]) 02/2011 and 03/2011   Wore a MONITOR=brief run of psvt that are very short, as well a frequent PACs and sinus tachycardia   . Stress incontinence     Past Surgical History:  Procedure Laterality Date  . BUNIONECTOMY     LEFT  . DOPPLER ECHOCARDIOGRAPHY  03/03/2011   EF greater than 55; Moderate concentric LVH, grade 1 1 diastolic dysfunction with mildly elevated LVEDP/LAP; RV pressure 30-40 mmHg;   . LAPAROSCOPIC PARTIAL COLECTOMY N/A 07/05/2013   Procedure: LAPAROSCOPIC ASSISTED SIGMOID COLECTOMY AND PROCTOSCOPY;  Surgeon: Odis Hollingshead, MD;  Location: [facility];  Service:  General;  Laterality: N/A;  . LEFT HEART CATHETERIZATION WITH CORONARY ANGIOGRAM N/A 05/22/2013   Procedure: LEFT HEART CATHETERIZATION WITH CORONARY ANGIOGRAM;  Surgeon: Leonie Man, MD;  Location: [facility] CATH LAB;  Service: Cardiovascular: Non-obstructive Disease - False + Nuc ST:  . NM MYOVIEW LTD  05/15/2013   INTERMEDIATE RISK - small area of inferior reversibility suggestive of possible ischemia. Cannot exclude bowel artifact.  Marland Kitchen  NM PET IMG ALZHEIMERS    . SHOULDER SURGERY      Current Meds  Medication Sig  . acetaminophen (TYLENOL) 500 MG tablet Take 500 mg by mouth every 6 (six) hours as needed for moderate pain.  . Calcium Citrate-Vitamin D (CALCIUM CITRATE + D3 PO) Take 2 tablets by mouth daily.   . Cholecalciferol (VITAMIN D3) 2000 units TABS Take 2 tablets by mouth daily.  . Coenzyme Q10 (CO Q 10) 100 MG CAPS Take 100 mg by mouth daily.   . DULoxetine (CYMBALTA) 60 MG capsule Take 60 mg by mouth every morning.   Marland Kitchen esomeprazole (NEXIUM) 40 MG capsule Take 40 mg by mouth daily before breakfast.  . ibuprofen (ADVIL,MOTRIN) 200 MG tablet Take 400 mg by mouth every 6 (six) hours as needed for moderate pain.  Marland Kitchen LORazepam (ATIVAN) 1 MG tablet Take 1 mg by mouth 3 (three) times daily.   Marland Kitchen losartan-hydrochlorothiazide (HYZAAR) 100-25 MG per tablet Take 1 tablet by mouth daily.  . metoprolol (LOPRESSOR) 50 MG tablet TAKE 1 TABLET (50 MG TOTAL) BY MOUTH 2 (TWO) TIMES DAILY.  . simvastatin (ZOCOR) 20 MG tablet Take 20 mg by mouth every evening.    Allergies  Allergen Reactions  . Dilaudid [Hydromorphone Hcl] Other (See Comments)    hallucination  . Novocain [Procaine]     Heart palpitations   . Prednisone Other (See Comments)    Red flush area across her face  . Sulfa Antibiotics Other (See Comments)    Sore in top of mouth  . Penicillins Rash    Social History   Social History  . Marital status: Single    Spouse name: N/A  . Number of children: N/A  . Years of education: N/A   Social History Main Topics  . Smoking status: Never Smoker  . Smokeless tobacco: Never Used  . Alcohol use No  . Drug use: No  . Sexual activity: Not Asked   Other Topics Concern  . None   Social History Narrative   She is a single woman. 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 She does walk, walking her   dog, but has been bothered recently by her knee. She does not smoke, does not drink. She walks her dog about 4-5 days a  week and does this for about 30 minutes a day.     family history includes Alzheimer's disease in her mother; Cancer in her paternal aunt; Diabetes in her mother; Hypertension in her maternal grandfather, maternal grandmother, and mother; Stroke in her maternal grandfather and paternal grandmother.  Wt Readings from Last 3 Encounters:  05/14/16 116.6 kg (257 lb)  05/15/15 122 kg (269 lb)  05/13/14 123.7 kg (272 lb 11.2 oz)    PHYSICAL EXAM BP 140/90   Pulse 71   Ht 5\' 1"  (1.549 m)   Wt 116.6 kg (257 lb)   BMI 48.56 kg/m - blood pressure PCPs office was 120/80 mmHg. General appearance: alert, cooperative, appears stated age, no distress and morbidly obese  HEENT: [place]/AT, EOMI, MMM, anicteric sclera Neck: no  adenopathy, no carotid bruit and no JVD  Lungs: clear to auscultation bilaterally, normal percussion bilaterally and non-labored  Heart: regular rate and rhythm, S1, S2 normal, no murmur, click, rub or gallop; unable to palpate PMI  Abdomen: soft, non-tender; bowel sounds normal; no masses, no organomegaly; significant truncal obesity  Extremities: extremities normal, atraumatic, no cyanosis, or edema  Pulses: 2+ and symmetric;  Neurologic: Mental status: Alert, oriented, thought content appropriate  Cranial nerves: normal (II-XII grossly intact))    Adult ECG Report  Rate: 71 ;  Rhythm: normal sinus rhythm and Cannot exclude inferior MI, age undetermined. Otherwise normal axis, intervals and durations.;   Narrative Interpretation: Stable EKG  Other studies Reviewed: Additional studies/ records that were reviewed today include:  Recent Labs:  Labs checked by PCP.    ASSESSMENT / PLAN: Problem List Items Addressed This Visit    Dyslipidemia, goal LDL below 130 but closer to 100 (Chronic)    On statin. Monitored by PCP. Unfortunately I do not have those results.      Essential hypertension (Chronic)    Much like last year, her blood pressure is borderline when she  sees me, but is well-controlled when she started PCP. She is on a good dose of beta blocker and Hyzaar. I'm reluctant to add a new medication or to titrate her beta blocker any further. We do have some room to increase her beta blocker, however that may be at the risk of some fatigue issues. Would probably consider adding a fourth agent if her blood pressure stays elevated.      Exertional dyspnea (Chronic)    Probably more related to deconditioning. He has had normal cardiac evaluation to date. It does not sound consistent with chronotropic incompetence      Heart palpitations - Primary (Chronic)    No symptoms to suggest PSVT. She may be noticing some the PACs and PVCs. But for now they're really well controlled on current dose of beta blocker.      Relevant Orders   EKG [id number]   Metabolic syndrome: Obesity, hypertension, diabetes (Chronic)    Relatively well-controlled blood pressure. Lipids being treated with Zocor, and actively trying to lose weight.      Morbid obesity with BMI of 45.0-49.9, adult ([facility]) (Chronic)    Actually I didn't realize that she lost 12 pounds since her last visit until he knew the end of the visit. I congratulated her on her efforts. Continue to recommend monitoring her diet and increasing her exercise level.      PSVT (paroxysmal supraventricular tachycardia) - history of (Chronic)    She seems to be doing quite well with her current dose of beta blocker. If she has had recurrent episodes, they're easily broken with vagal maneuvers. Otherwise relatively asymptomatic in the last year. We refreshed her education on vagal maneuvers. Plan: Continue current dose of beta blocker.      Relevant Orders   EKG 12-Lead      Current medicines are reviewed at length with the patient today. (+/- concerns) None The following changes have been made: None  Patient Instructions  No changes to medications  Your physician wants you to follow-up in: [place]  DR. Karee Christopherson. You will receive a reminder letter in the mail two months in advance. If you don't receive a letter, please call our office to schedule the follow-up appointment.    Studies Ordered:   Orders Placed This Encounter  Procedures  . EKG 12-Lead  Diana Davidson, M.D., M.S. Interventional Cardiologist   Pager # [phone] Phone # [phone] [address] [place], [place] [address] 

## 2016-05-14 NOTE — Patient Instructions (Signed)
No changes to medications  Your physician wants you to follow-up in: [place] DR. HARDING. You will receive a reminder letter in the mail two months in advance. If you don't receive a letter, please call our office to schedule the follow-up appointment.

## 2016-05-16 ENCOUNTER — Encounter: Payer: Self-pay | Admitting: Cardiology

## 2016-05-16 NOTE — Assessment & Plan Note (Signed)
Much like last year, her blood pressure is borderline when she sees me, but is well-controlled when she started PCP. She is on a good dose of beta blocker and Hyzaar. I'm reluctant to add a new medication or to titrate her beta blocker any further. We do have some room to increase her beta blocker, however that may be at the risk of some fatigue issues. Would probably consider adding a fourth agent if her blood pressure stays elevated.

## 2016-05-16 NOTE — Assessment & Plan Note (Signed)
She seems to be doing quite well with her current dose of beta blocker. If she has had recurrent episodes, they're easily broken with vagal maneuvers. Otherwise relatively asymptomatic in the last year. We refreshed her education on vagal maneuvers. Plan: Continue current dose of beta blocker.

## 2016-05-16 NOTE — Assessment & Plan Note (Signed)
No symptoms to suggest PSVT. She may be noticing some the PACs and PVCs. But for now they're really well controlled on current dose of beta blocker.

## 2016-05-16 NOTE — Assessment & Plan Note (Signed)
Probably more related to deconditioning. He has had normal cardiac evaluation to date. It does not sound consistent with chronotropic incompetence

## 2016-05-16 NOTE — Assessment & Plan Note (Signed)
Actually I didn't realize that she lost 12 pounds since her last visit until he knew the end of the visit. I congratulated her on her efforts. Continue to recommend monitoring her diet and increasing her exercise level.

## 2016-05-16 NOTE — Assessment & Plan Note (Signed)
On statin. Monitored by PCP. Unfortunately I do not have those results.

## 2016-05-16 NOTE — Assessment & Plan Note (Signed)
Relatively well-controlled blood pressure. Lipids being treated with Zocor, and actively trying to lose weight.

## 2016-05-27 ENCOUNTER — Other Ambulatory Visit: Payer: Self-pay | Admitting: Cardiology

## 2016-09-02 ENCOUNTER — Other Ambulatory Visit: Payer: Self-pay | Admitting: Cardiology

## 2016-09-02 NOTE — Telephone Encounter (Signed)
Rx(s) sent to pharmacy electronically.  

## 2016-11-16 ENCOUNTER — Other Ambulatory Visit: Payer: Self-pay | Admitting: *Deleted

## 2016-11-16 MED ORDER — METOPROLOL TARTRATE 50 MG PO TABS: 50.0000 mg | ORAL_TABLET | Freq: Two times a day (BID) | ORAL | 11 refills | Status: DC

## 2016-11-18 ENCOUNTER — Other Ambulatory Visit: Payer: Self-pay | Admitting: *Deleted

## 2016-11-18 MED ORDER — LOSARTAN POTASSIUM-HCTZ 100-25 MG PO TABS: 1.0000 | ORAL_TABLET | Freq: Every day | ORAL | 11 refills | Status: DC

## 2016-11-18 MED ORDER — METOPROLOL TARTRATE 50 MG PO TABS: 50.0000 mg | ORAL_TABLET | Freq: Two times a day (BID) | ORAL | 11 refills | Status: DC

## 2016-11-24 ENCOUNTER — Other Ambulatory Visit: Payer: Self-pay | Admitting: *Deleted

## 2016-11-24 MED ORDER — LOSARTAN POTASSIUM-HCTZ 100-25 MG PO TABS: 1.0000 | ORAL_TABLET | Freq: Every day | ORAL | 3 refills | Status: DC

## 2016-12-03 DIAGNOSIS — R69 Illness, unspecified: Secondary | ICD-10-CM | POA: Diagnosis not present

## 2016-12-06 ENCOUNTER — Other Ambulatory Visit: Payer: Self-pay | Admitting: Cardiology

## 2016-12-06 MED ORDER — METOPROLOL TARTRATE 50 MG PO TABS: 50.0000 mg | ORAL_TABLET | Freq: Two times a day (BID) | ORAL | 1 refills | Status: DC

## 2016-12-06 NOTE — Telephone Encounter (Signed)
New message  Pt wants medication supply changed to 90 day   *STAT* If patient is at the pharmacy, call can be transferred to refill team.   1. Which medications need to be refilled? (please list name of each medication and dose if known) Metoprolol 50mg  1tablet 2x day  2. Which pharmacy/location (including street and city if local pharmacy) is medication to be sent to? [facility] in [place] [phone]  3. Do they need a 30 day or 90 day supply? 90 day

## 2016-12-06 NOTE — Telephone Encounter (Signed)
Rx(s) sent to pharmacy electronically.  

## 2017-02-09 DIAGNOSIS — E7849 Other hyperlipidemia: Secondary | ICD-10-CM | POA: Diagnosis not present

## 2017-02-09 DIAGNOSIS — R69 Illness, unspecified: Secondary | ICD-10-CM | POA: Diagnosis not present

## 2017-02-09 DIAGNOSIS — K219 Gastro-esophageal reflux disease without esophagitis: Secondary | ICD-10-CM | POA: Diagnosis not present

## 2017-02-09 DIAGNOSIS — J309 Allergic rhinitis, unspecified: Secondary | ICD-10-CM | POA: Diagnosis not present

## 2017-02-09 DIAGNOSIS — M1990 Unspecified osteoarthritis, unspecified site: Secondary | ICD-10-CM | POA: Diagnosis not present

## 2017-02-09 DIAGNOSIS — N393 Stress incontinence (female) (male): Secondary | ICD-10-CM | POA: Diagnosis not present

## 2017-02-09 DIAGNOSIS — M859 Disorder of bone density and structure, unspecified: Secondary | ICD-10-CM | POA: Diagnosis not present

## 2017-02-09 DIAGNOSIS — I10 Essential (primary) hypertension: Secondary | ICD-10-CM | POA: Diagnosis not present

## 2017-02-09 DIAGNOSIS — E119 Type 2 diabetes mellitus without complications: Secondary | ICD-10-CM | POA: Diagnosis not present

## 2017-02-09 DIAGNOSIS — M19049 Primary osteoarthritis, unspecified hand: Secondary | ICD-10-CM | POA: Diagnosis not present

## 2017-03-18 DIAGNOSIS — R05 Cough: Secondary | ICD-10-CM | POA: Diagnosis not present

## 2017-03-18 DIAGNOSIS — I10 Essential (primary) hypertension: Secondary | ICD-10-CM | POA: Diagnosis not present

## 2017-03-18 DIAGNOSIS — J209 Acute bronchitis, unspecified: Secondary | ICD-10-CM | POA: Diagnosis not present

## 2017-03-18 DIAGNOSIS — Z6841 Body Mass Index (BMI) 40.0 and over, adult: Secondary | ICD-10-CM | POA: Diagnosis not present

## 2017-03-18 DIAGNOSIS — E119 Type 2 diabetes mellitus without complications: Secondary | ICD-10-CM | POA: Diagnosis not present

## 2017-03-24 DIAGNOSIS — R05 Cough: Secondary | ICD-10-CM | POA: Diagnosis not present

## 2017-03-24 DIAGNOSIS — J0100 Acute maxillary sinusitis, unspecified: Secondary | ICD-10-CM | POA: Diagnosis not present

## 2017-03-24 DIAGNOSIS — Z6841 Body Mass Index (BMI) 40.0 and over, adult: Secondary | ICD-10-CM | POA: Diagnosis not present

## 2017-03-24 DIAGNOSIS — J209 Acute bronchitis, unspecified: Secondary | ICD-10-CM | POA: Diagnosis not present

## 2017-05-07 DIAGNOSIS — R69 Illness, unspecified: Secondary | ICD-10-CM | POA: Diagnosis not present

## 2017-05-11 DIAGNOSIS — R69 Illness, unspecified: Secondary | ICD-10-CM | POA: Diagnosis not present

## 2017-05-20 ENCOUNTER — Ambulatory Visit: Payer: Medicare HMO | Admitting: Cardiology

## 2017-05-20 ENCOUNTER — Encounter: Payer: Self-pay | Admitting: Cardiology

## 2017-05-20 VITALS — BP 128/90 | HR 70 | Ht 61.0 in | Wt 252.8 lb

## 2017-05-20 DIAGNOSIS — E8881 Metabolic syndrome: Secondary | ICD-10-CM

## 2017-05-20 DIAGNOSIS — I471 Supraventricular tachycardia: Secondary | ICD-10-CM | POA: Diagnosis not present

## 2017-05-20 DIAGNOSIS — R931 Abnormal findings on diagnostic imaging of heart and coronary circulation: Secondary | ICD-10-CM

## 2017-05-20 DIAGNOSIS — R002 Palpitations: Secondary | ICD-10-CM | POA: Diagnosis not present

## 2017-05-20 DIAGNOSIS — I10 Essential (primary) hypertension: Secondary | ICD-10-CM | POA: Diagnosis not present

## 2017-05-20 DIAGNOSIS — E785 Hyperlipidemia, unspecified: Secondary | ICD-10-CM | POA: Diagnosis not present

## 2017-05-20 NOTE — Patient Instructions (Signed)
Medication Instructions:  Your physician recommends that you continue on your current medications as directed. Please refer to the Current Medication list given to you today.  Follow-Up: Your physician wants you to follow-up in: 12 months with Dr. Ellyn Hack. You will receive a reminder letter in the mail two months in advance. If you don't receive a letter, please call our office to schedule the follow-up appointment.      If you need a refill on your cardiac medications before your next appointment, please call your pharmacy.

## 2017-05-20 NOTE — Progress Notes (Signed)
PCP: Burnard Bunting, MD  Clinic Note: Chief Complaint  Patient presents with  . Follow-up    History of SVT    HPI: Diana Davidson is a 67 y.o. female with a PMH below who presents today for Annual follow-up for PSVT. She has intermediate risk rest test back in 2015 and had a cardiac cath showing nonobstructive CAD.  Diana Davidson was last seen in March 2018. She was doing well at that time.  She was still noticing occasional rapid heartbeat spells lasting less than a minute.  Nothing sustained and no symptoms with it.  Usually successful use of vagal maneuvers.  Less noticeable since her Lopressor dose was adjusted.  Admittedly not active with routine exercise.  But denies any cardiac symptoms.  -- started on Metformin by PCP  Recent Hospitalizations: n/a  Studies Reviewed: none  Interval History: Diana Davidson presents today doing well without any major cardiac complaints.  No evidence of any rapid irregular heartbeats palpitations.  She said she had one episode 6 weeks ago when she felt her heart rate go up in the 100 1040 range, but thinks it had to do with a change in her beta-blocker.  She had a new formulation, and for the first couple days her heart rate went up.  After that, she said that everything went back to normal. At most, she will feel some palpitations off and on but nothing lasting more than a few seconds. She notes varicose vein swollen and tender in the right lower leg, but otherwise no PND, orthopnea or edema. She does note that she is deconditioned and will get short of breath if she walks around too much, but not with routine activity.  Cardiovascular ROS: no chest pain or dyspnea on exertion positive for - rapid heart rate and R leg Varicose Vein negative for - edema, irregular heartbeat, murmur, orthopnea, paroxysmal nocturnal dyspnea, shortness of breath or Syncope/near syncope, TIA/amaurosis fugax: Claudication    ROS: A comprehensive was performed. Review of  Systems  Constitutional: Negative for malaise/fatigue (Just lack of motivation to get exercise.) and weight loss.  HENT: Negative for congestion and nosebleeds.   Eyes: Negative for blurred vision.  Respiratory: Negative for cough and shortness of breath.   Cardiovascular:       Per history of present illness  Gastrointestinal: Negative for blood in stool and melena.  Genitourinary: Negative for hematuria.  Musculoskeletal: Positive for joint pain (Arthritis pains).  Neurological: Negative for dizziness and headaches.  Psychiatric/Behavioral: Negative for memory loss. The patient has insomnia.   All other systems reviewed and are negative.   Past Medical History:  Diagnosis Date  . Abnormal nuclear stress test    False +; Cardiac cath with no obstructive CAD.  Marland Kitchen Anemia   . Anxiety    hx panic attacks - none for several years  . Arthritis   . Colon cancer [facility])    April 2015: LAP Sigmoidectomy (Dr. Zella Richer)   . Diabetes mellitus without complication ([place]) 04/7780   HgbA1c 6  - Borderline diabetes  . Fibromyalgia   . GERD (gastroesophageal reflux disease)   . Heart palpitations    PSVT, PVCs, PACS - all short bursts, no syncope  . Hyperlipidemia   . Hypertension   . Morbid obesity with BMI of 45.0-49.9, adult ([place])   . Neuromuscular disorder ([place])   . Osteopenia   . PSVT (paroxysmal supraventricular tachycardia) ([place]) 02/2011 and 03/2011   Wore a MONITOR=brief run of psvt that are  very short, as well a frequent PACs and sinus tachycardia   . Stress incontinence     Past Surgical History:  Procedure Laterality Date  . BUNIONECTOMY     LEFT  . DOPPLER ECHOCARDIOGRAPHY  03/03/2011   EF greater than 55; Moderate concentric LVH, grade 1 1 diastolic dysfunction with mildly elevated LVEDP/LAP; RV pressure 30-40 mmHg;   . LAPAROSCOPIC PARTIAL COLECTOMY N/A 07/05/2013   Procedure: LAPAROSCOPIC ASSISTED SIGMOID COLECTOMY AND PROCTOSCOPY;  Surgeon: Odis Hollingshead, MD;  Location:  [facility] ORS;  Service: General;  Laterality: N/A;  . LEFT HEART CATHETERIZATION WITH CORONARY ANGIOGRAM N/A 05/22/2013   Procedure: LEFT HEART CATHETERIZATION WITH CORONARY ANGIOGRAM;  Surgeon: Leonie Man, MD;  Location: [facility] CATH LAB;  Service: Cardiovascular: Non-obstructive Disease - False + Nuc ST:  . NM MYOVIEW LTD  05/15/2013   INTERMEDIATE RISK - small area of inferior reversibility suggestive of possible ischemia. Cannot exclude bowel artifact.  Marland Kitchen NM PET IMG ALZHEIMERS    . SHOULDER SURGERY      Current Meds  Medication Sig  . acetaminophen (TYLENOL) 500 MG tablet Take 500 mg by mouth every 6 (six) hours as needed for moderate pain.  . Calcium Citrate-Vitamin D (CALCIUM CITRATE + D3 PO) Take 2 tablets by mouth daily.   . Cholecalciferol (VITAMIN D3) 2000 units TABS Take 2 tablets by mouth daily.  . Coenzyme Q10 (CO Q 10) 100 MG CAPS Take 100 mg by mouth daily.   . DULoxetine (CYMBALTA) 60 MG capsule Take 60 mg by mouth every morning.   Marland Kitchen esomeprazole (NEXIUM) 40 MG capsule Take 40 mg by mouth daily before breakfast.  . ibuprofen (ADVIL,MOTRIN) 200 MG tablet Take 400 mg by mouth every 6 (six) hours as needed for moderate pain.  Marland Kitchen LORazepam (ATIVAN) 1 MG tablet Take 1 mg by mouth 3 (three) times daily.   Marland Kitchen losartan-hydrochlorothiazide (HYZAAR) 100-25 MG tablet Take 1 tablet by mouth daily.  . metFORMIN (GLUCOPHAGE) 500 MG tablet Take 500 mg by mouth 2 (two) times daily.  . metoprolol tartrate (LOPRESSOR) 50 MG tablet Take 1 tablet (50 mg total) by mouth 2 (two) times daily.  . simvastatin (ZOCOR) 20 MG tablet Take 20 mg by mouth every evening.    Allergies  Allergen Reactions  . Dilaudid [Hydromorphone Hcl] Other (See Comments)    hallucination  . Novocain [Procaine]     Heart palpitations   . Prednisone Other (See Comments)    Red flush area across her face  . Sulfa Antibiotics Other (See Comments)    Sore in top of mouth  . Penicillins Rash    Social History    Socioeconomic History  . Marital status: Single    Spouse name: None  . Number of children: None  . Years of education: None  . Highest education level: None  Social Needs  . Financial resource strain: None  . Food insecurity - worry: None  . Food insecurity - inability: None  . Transportation needs - medical: None  . Transportation needs - non-medical: None  Occupational History  . None  Tobacco Use  . Smoking status: Never Smoker  . Smokeless tobacco: Never Used  Substance and Sexual Activity  . Alcohol use: No  . Drug use: No  . Sexual activity: None  Other Topics Concern  . None  Social History Narrative   She is a single woman. 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 She does walk, walking her   dog, but has  been bothered recently by her knee. She does not smoke, does not drink. She walks her dog about 4-5 days a week and does this for about 30 minutes a day.     family history includes Alzheimer's disease in her mother; Cancer in her paternal aunt; Diabetes in her mother; Hypertension in her maternal grandfather, maternal grandmother, and mother; Stroke in her maternal grandfather and paternal grandmother.  Wt Readings from Last 3 Encounters:  05/20/17 252 lb 12.8 oz (114.7 kg)  05/14/16 257 lb (116.6 kg)  05/15/15 269 lb (122 kg)    PHYSICAL EXAM BP 128/90 (BP Location: Right Arm, Patient Position: Sitting, Cuff Size: Large)   Pulse 70   Ht 5\' 1"  (1.549 m)   Wt 252 lb 12.8 oz (114.7 kg)   BMI 47.77 kg/m -  Physical Exam  Constitutional: She appears well-developed and well-nourished. No distress.  Morbidly obese.  Well-groomed.  HENT:  Head: Normocephalic and atraumatic.  Eyes: EOM are normal.  Neck: No hepatojugular reflux and no JVD present. Carotid bruit is not present.  Cardiovascular: Normal rate, regular rhythm, S1 normal, S2 normal, intact distal pulses and normal pulses.  No extrasystoles are present. PMI is not displaced (Unable to palpate). Exam  reveals distant heart sounds (Somewhat distant due to body habitus). Exam reveals no gallop and no friction rub.  No murmur heard. Pulmonary/Chest: Effort normal and breath sounds normal. No respiratory distress. She has no wheezes. She has no rales.  Abdominal: Soft. Bowel sounds are normal. She exhibits no distension. There is no tenderness. There is no rebound.  Musculoskeletal: Normal range of motion. She exhibits no edema (Right calf has a peripheral varicose vein that is somewhat engorged.).  Neurological: She is alert.  Psychiatric: She has a normal mood and affect. Her behavior is normal. Judgment and thought content normal.  Nursing note and vitals reviewed.   Adult ECG Report  Rate: 70 ;  Rhythm: normal sinus rhythm and Cannot exclude inferior MI, age undetermined. Otherwise normal axis, intervals and durations.;   Narrative Interpretation: Stable EKG  Other studies Reviewed: Additional studies/ records that were reviewed today include:  Recent Labs:  Labs checked by PCP.    ASSESSMENT / PLAN: Problem List Items Addressed This Visit    PSVT (paroxysmal supraventricular tachycardia) - history of - Primary (Chronic)    She has maybe had to use an additional dose (she takes a half a dose) once since I last saw her.  We reviewed vagal maneuvers. Continue current dose of beta-blocker.      Relevant Orders   EKG 40-CXKG   Metabolic syndrome: Obesity, hypertension, diabetes (Chronic)    Blood pressure controlled.  On statin for lipids, but not sure what labs are (goal LDL less than 70), now on simvastatin. She was told that her HDL is greater than the LDL which probably means is good.      Relevant Orders   EKG 12-Lead   Heart palpitations (Chronic)    Well-controlled with current dose of beta-blocker.      Relevant Orders   EKG 12-Lead   Essential hypertension (Chronic)    Well-controlled on current dose of beta-blocker plus Hyzaar.      Relevant Orders   EKG 12-Lead    Dyslipidemia, goal LDL below 130 but closer to 100 (Chronic)    Now with obesity and hypertension plus diabetes or prediabetes, goal LDL should be less than 70.  Monitored by PCP with labs not available.  Abnormal nuclear cardiac imaging test - Intermediate Risk -- false positive with normal cath (Chronic)    Stable.  Doing well with no further anginal symptoms.  Cardiac catheterization showed no evidence of significant CAD.  Would avoid stress test in the future --> would probably check coronary CTA if necessary.         Current medicines are reviewed at length with the patient today. (+/- concerns) None The following changes have been made: None  Patient Instructions  Medication Instructions:  Your physician recommends that you continue on your current medications as directed. Please refer to the Current Medication list given to you today.  Follow-Up: Your physician wants you to follow-up in: 12 months with Dr. Ellyn Hack. You will receive a reminder letter in the mail two months in advance. If you don't receive a letter, please call our office to schedule the follow-up appointment.      If you need a refill on your cardiac medications before your next appointment, please call your pharmacy.     Studies Ordered:   Orders Placed This Encounter  Procedures  . EKG 12-Lead      Glenetta Hew, M.D., M.S. Interventional Cardiologist   Pager # [phone] Phone # [phone] [address] [place], [place] [address]

## 2017-05-22 ENCOUNTER — Encounter: Payer: Self-pay | Admitting: Cardiology

## 2017-05-22 NOTE — Assessment & Plan Note (Signed)
She has maybe had to use an additional dose (she takes a half a dose) once since I last saw her.  We reviewed vagal maneuvers. Continue current dose of beta-blocker.

## 2017-05-22 NOTE — Assessment & Plan Note (Signed)
Now with obesity and hypertension plus diabetes or prediabetes, goal LDL should be less than 70.  Monitored by PCP with labs not available.

## 2017-05-22 NOTE — Assessment & Plan Note (Signed)
Well-controlled on current dose of beta-blocker plus Hyzaar.

## 2017-05-22 NOTE — Assessment & Plan Note (Signed)
Well-controlled with current dose of beta-blocker.

## 2017-05-22 NOTE — Assessment & Plan Note (Signed)
Stable.  Doing well with no further anginal symptoms.  Cardiac catheterization showed no evidence of significant CAD.  Would avoid stress test in the future --> would probably check coronary CTA if necessary.

## 2017-05-22 NOTE — Assessment & Plan Note (Signed)
Blood pressure controlled.  On statin for lipids, but not sure what labs are (goal LDL less than 70), now on simvastatin. She was told that her HDL is greater than the LDL which probably means is good.

## 2017-07-14 DIAGNOSIS — E119 Type 2 diabetes mellitus without complications: Secondary | ICD-10-CM | POA: Diagnosis not present

## 2017-07-14 DIAGNOSIS — H5213 Myopia, bilateral: Secondary | ICD-10-CM | POA: Diagnosis not present

## 2017-07-14 DIAGNOSIS — H524 Presbyopia: Secondary | ICD-10-CM | POA: Diagnosis not present

## 2017-07-18 DIAGNOSIS — M19049 Primary osteoarthritis, unspecified hand: Secondary | ICD-10-CM | POA: Diagnosis not present

## 2017-07-18 DIAGNOSIS — E1169 Type 2 diabetes mellitus with other specified complication: Secondary | ICD-10-CM | POA: Diagnosis not present

## 2017-07-18 DIAGNOSIS — I10 Essential (primary) hypertension: Secondary | ICD-10-CM | POA: Diagnosis not present

## 2017-07-18 DIAGNOSIS — J309 Allergic rhinitis, unspecified: Secondary | ICD-10-CM | POA: Diagnosis not present

## 2017-07-18 DIAGNOSIS — N393 Stress incontinence (female) (male): Secondary | ICD-10-CM | POA: Diagnosis not present

## 2017-07-18 DIAGNOSIS — M1990 Unspecified osteoarthritis, unspecified site: Secondary | ICD-10-CM | POA: Diagnosis not present

## 2017-07-18 DIAGNOSIS — E7849 Other hyperlipidemia: Secondary | ICD-10-CM | POA: Diagnosis not present

## 2017-07-18 DIAGNOSIS — K219 Gastro-esophageal reflux disease without esophagitis: Secondary | ICD-10-CM | POA: Diagnosis not present

## 2017-07-18 DIAGNOSIS — R69 Illness, unspecified: Secondary | ICD-10-CM | POA: Diagnosis not present

## 2017-07-18 DIAGNOSIS — M859 Disorder of bone density and structure, unspecified: Secondary | ICD-10-CM | POA: Diagnosis not present

## 2017-07-21 DIAGNOSIS — R69 Illness, unspecified: Secondary | ICD-10-CM | POA: Diagnosis not present

## 2017-08-09 DIAGNOSIS — R05 Cough: Secondary | ICD-10-CM | POA: Diagnosis not present

## 2017-08-09 DIAGNOSIS — J329 Chronic sinusitis, unspecified: Secondary | ICD-10-CM | POA: Diagnosis not present

## 2017-08-09 DIAGNOSIS — Z6841 Body Mass Index (BMI) 40.0 and over, adult: Secondary | ICD-10-CM | POA: Diagnosis not present

## 2017-08-09 DIAGNOSIS — R509 Fever, unspecified: Secondary | ICD-10-CM | POA: Diagnosis not present

## 2017-08-12 DIAGNOSIS — J329 Chronic sinusitis, unspecified: Secondary | ICD-10-CM | POA: Diagnosis not present

## 2017-08-12 DIAGNOSIS — R05 Cough: Secondary | ICD-10-CM | POA: Diagnosis not present

## 2017-08-12 DIAGNOSIS — J029 Acute pharyngitis, unspecified: Secondary | ICD-10-CM | POA: Diagnosis not present

## 2017-08-12 DIAGNOSIS — J209 Acute bronchitis, unspecified: Secondary | ICD-10-CM | POA: Diagnosis not present

## 2017-08-12 DIAGNOSIS — Z6841 Body Mass Index (BMI) 40.0 and over, adult: Secondary | ICD-10-CM | POA: Diagnosis not present

## 2017-08-12 DIAGNOSIS — R509 Fever, unspecified: Secondary | ICD-10-CM | POA: Diagnosis not present

## 2017-08-29 DIAGNOSIS — Z1231 Encounter for screening mammogram for malignant neoplasm of breast: Secondary | ICD-10-CM | POA: Diagnosis not present

## 2017-09-02 DIAGNOSIS — K5730 Diverticulosis of large intestine without perforation or abscess without bleeding: Secondary | ICD-10-CM | POA: Diagnosis not present

## 2017-09-02 DIAGNOSIS — Z85038 Personal history of other malignant neoplasm of large intestine: Secondary | ICD-10-CM | POA: Diagnosis not present

## 2017-09-02 DIAGNOSIS — K644 Residual hemorrhoidal skin tags: Secondary | ICD-10-CM | POA: Diagnosis not present

## 2017-09-02 DIAGNOSIS — Z86010 Personal history of colonic polyps: Secondary | ICD-10-CM | POA: Diagnosis not present

## 2017-09-02 DIAGNOSIS — D126 Benign neoplasm of colon, unspecified: Secondary | ICD-10-CM | POA: Diagnosis not present

## 2017-09-06 DIAGNOSIS — D126 Benign neoplasm of colon, unspecified: Secondary | ICD-10-CM | POA: Diagnosis not present

## 2017-09-29 DIAGNOSIS — Z01419 Encounter for gynecological examination (general) (routine) without abnormal findings: Secondary | ICD-10-CM | POA: Diagnosis not present

## 2017-09-29 DIAGNOSIS — Z6841 Body Mass Index (BMI) 40.0 and over, adult: Secondary | ICD-10-CM | POA: Diagnosis not present

## 2017-12-02 DIAGNOSIS — R69 Illness, unspecified: Secondary | ICD-10-CM | POA: Diagnosis not present

## 2017-12-05 DIAGNOSIS — I10 Essential (primary) hypertension: Secondary | ICD-10-CM | POA: Diagnosis not present

## 2017-12-05 DIAGNOSIS — R82998 Other abnormal findings in urine: Secondary | ICD-10-CM | POA: Diagnosis not present

## 2017-12-05 DIAGNOSIS — E1169 Type 2 diabetes mellitus with other specified complication: Secondary | ICD-10-CM | POA: Diagnosis not present

## 2017-12-05 DIAGNOSIS — M859 Disorder of bone density and structure, unspecified: Secondary | ICD-10-CM | POA: Diagnosis not present

## 2017-12-05 DIAGNOSIS — E7849 Other hyperlipidemia: Secondary | ICD-10-CM | POA: Diagnosis not present

## 2017-12-13 ENCOUNTER — Other Ambulatory Visit: Payer: Self-pay | Admitting: Cardiology

## 2017-12-19 DIAGNOSIS — I10 Essential (primary) hypertension: Secondary | ICD-10-CM | POA: Diagnosis not present

## 2017-12-19 DIAGNOSIS — K219 Gastro-esophageal reflux disease without esophagitis: Secondary | ICD-10-CM | POA: Diagnosis not present

## 2017-12-19 DIAGNOSIS — R69 Illness, unspecified: Secondary | ICD-10-CM | POA: Diagnosis not present

## 2017-12-19 DIAGNOSIS — N393 Stress incontinence (female) (male): Secondary | ICD-10-CM | POA: Diagnosis not present

## 2017-12-19 DIAGNOSIS — M1990 Unspecified osteoarthritis, unspecified site: Secondary | ICD-10-CM | POA: Diagnosis not present

## 2017-12-19 DIAGNOSIS — M19049 Primary osteoarthritis, unspecified hand: Secondary | ICD-10-CM | POA: Diagnosis not present

## 2017-12-19 DIAGNOSIS — M859 Disorder of bone density and structure, unspecified: Secondary | ICD-10-CM | POA: Diagnosis not present

## 2017-12-19 DIAGNOSIS — Z0000 Encounter for general adult medical examination without abnormal findings: Secondary | ICD-10-CM | POA: Diagnosis not present

## 2017-12-19 DIAGNOSIS — E1169 Type 2 diabetes mellitus with other specified complication: Secondary | ICD-10-CM | POA: Diagnosis not present

## 2017-12-19 DIAGNOSIS — E7849 Other hyperlipidemia: Secondary | ICD-10-CM | POA: Diagnosis not present

## 2018-01-10 DIAGNOSIS — I10 Essential (primary) hypertension: Secondary | ICD-10-CM | POA: Diagnosis not present

## 2018-01-10 DIAGNOSIS — J069 Acute upper respiratory infection, unspecified: Secondary | ICD-10-CM | POA: Diagnosis not present

## 2018-01-10 DIAGNOSIS — R05 Cough: Secondary | ICD-10-CM | POA: Diagnosis not present

## 2018-01-10 DIAGNOSIS — Z6841 Body Mass Index (BMI) 40.0 and over, adult: Secondary | ICD-10-CM | POA: Diagnosis not present

## 2018-01-16 ENCOUNTER — Other Ambulatory Visit: Payer: Self-pay | Admitting: Cardiology

## 2018-01-26 ENCOUNTER — Telehealth: Payer: Self-pay | Admitting: Cardiology

## 2018-01-26 MED ORDER — LOSARTAN POTASSIUM 100 MG PO TABS: 100.0000 mg | ORAL_TABLET | Freq: Every day | ORAL | 3 refills | Status: DC

## 2018-01-26 MED ORDER — HYDROCHLOROTHIAZIDE 25 MG PO TABS: 25.0000 mg | ORAL_TABLET | Freq: Every day | ORAL | 3 refills | Status: DC

## 2018-01-26 NOTE — Telephone Encounter (Signed)
New message   Pt c/o medication issue:  1. Name of Medication: losartan-hydrochlorothiazide (HYZAAR) 100-25 MG tabletlosartan-hydrochlorothiazide (HYZAAR) 100-25 MG tablet  2. How are you currently taking this medication (dosage and times per day)? 1 time daily  3. Are you having a reaction (difficulty breathing--STAT)?n/a  4. What is your medication issue? Patient wants to know if this medication be switched to another medication that is equivalent to this medication? Please advise.

## 2018-01-26 NOTE — Telephone Encounter (Signed)
Pt sts that her pharmacy has told her that there is a shortage on Hyzaar and her prescription needs to be change to  Losartan 100mg  daily and HCTZ 25mg  daily. Pt sts that she only has a 2-3 day supply on hand. Adv pt that I will fwd the message to our Pharm-D for approval, and we will call her back to let know when the Rx is sent in. Pt voiced appreciation.

## 2018-06-19 DIAGNOSIS — K219 Gastro-esophageal reflux disease without esophagitis: Secondary | ICD-10-CM | POA: Diagnosis not present

## 2018-06-19 DIAGNOSIS — M19049 Primary osteoarthritis, unspecified hand: Secondary | ICD-10-CM | POA: Diagnosis not present

## 2018-06-19 DIAGNOSIS — I10 Essential (primary) hypertension: Secondary | ICD-10-CM | POA: Diagnosis not present

## 2018-06-19 DIAGNOSIS — M8580 Other specified disorders of bone density and structure, unspecified site: Secondary | ICD-10-CM | POA: Diagnosis not present

## 2018-06-19 DIAGNOSIS — M1990 Unspecified osteoarthritis, unspecified site: Secondary | ICD-10-CM | POA: Diagnosis not present

## 2018-06-19 DIAGNOSIS — M797 Fibromyalgia: Secondary | ICD-10-CM | POA: Diagnosis not present

## 2018-06-19 DIAGNOSIS — E785 Hyperlipidemia, unspecified: Secondary | ICD-10-CM | POA: Diagnosis not present

## 2018-06-19 DIAGNOSIS — E1169 Type 2 diabetes mellitus with other specified complication: Secondary | ICD-10-CM | POA: Diagnosis not present

## 2018-06-19 DIAGNOSIS — N393 Stress incontinence (female) (male): Secondary | ICD-10-CM | POA: Diagnosis not present

## 2018-06-19 DIAGNOSIS — R69 Illness, unspecified: Secondary | ICD-10-CM | POA: Diagnosis not present

## 2018-06-26 ENCOUNTER — Ambulatory Visit: Payer: Medicare HMO | Admitting: Cardiology

## 2018-08-24 DIAGNOSIS — H5203 Hypermetropia, bilateral: Secondary | ICD-10-CM | POA: Diagnosis not present

## 2018-08-24 DIAGNOSIS — E119 Type 2 diabetes mellitus without complications: Secondary | ICD-10-CM | POA: Diagnosis not present

## 2018-09-04 ENCOUNTER — Other Ambulatory Visit: Payer: Self-pay | Admitting: *Deleted

## 2018-09-04 MED ORDER — METOPROLOL TARTRATE 50 MG PO TABS: 50.0000 mg | ORAL_TABLET | Freq: Two times a day (BID) | ORAL | 1 refills | Status: DC

## 2018-09-27 DIAGNOSIS — Z1231 Encounter for screening mammogram for malignant neoplasm of breast: Secondary | ICD-10-CM | POA: Diagnosis not present

## 2018-09-27 DIAGNOSIS — Z803 Family history of malignant neoplasm of breast: Secondary | ICD-10-CM | POA: Diagnosis not present

## 2018-10-28 ENCOUNTER — Other Ambulatory Visit: Payer: Self-pay | Admitting: Cardiology

## 2018-11-02 ENCOUNTER — Ambulatory Visit: Payer: Medicare HMO | Admitting: Cardiology

## 2018-11-02 ENCOUNTER — Other Ambulatory Visit: Payer: Self-pay

## 2018-11-02 ENCOUNTER — Encounter: Payer: Self-pay | Admitting: Cardiology

## 2018-11-02 VITALS — BP 122/80 | HR 79 | Temp 97.2°F | Ht 62.0 in | Wt 251.0 lb

## 2018-11-02 DIAGNOSIS — E8881 Metabolic syndrome: Secondary | ICD-10-CM | POA: Diagnosis not present

## 2018-11-02 DIAGNOSIS — I471 Supraventricular tachycardia: Secondary | ICD-10-CM | POA: Diagnosis not present

## 2018-11-02 DIAGNOSIS — I10 Essential (primary) hypertension: Secondary | ICD-10-CM | POA: Diagnosis not present

## 2018-11-02 DIAGNOSIS — E785 Hyperlipidemia, unspecified: Secondary | ICD-10-CM | POA: Diagnosis not present

## 2018-11-02 DIAGNOSIS — R002 Palpitations: Secondary | ICD-10-CM | POA: Diagnosis not present

## 2018-11-02 DIAGNOSIS — R0609 Other forms of dyspnea: Secondary | ICD-10-CM

## 2018-11-02 DIAGNOSIS — Z6841 Body Mass Index (BMI) 40.0 and over, adult: Secondary | ICD-10-CM | POA: Diagnosis not present

## 2018-11-02 DIAGNOSIS — R0600 Dyspnea, unspecified: Secondary | ICD-10-CM

## 2018-11-02 MED ORDER — METOPROLOL TARTRATE 50 MG PO TABS: 50.0000 mg | ORAL_TABLET | Freq: Two times a day (BID) | ORAL | 3 refills | Status: DC

## 2018-11-02 MED ORDER — IRBESARTAN-HYDROCHLOROTHIAZIDE 150-12.5 MG PO TABS: 1.0000 | ORAL_TABLET | Freq: Every day | ORAL | 3 refills | Status: DC

## 2018-11-02 NOTE — Patient Instructions (Signed)
Medication Instructions:     MAY CHANGE LOSARTAN AND HYDROCHLOROTHIAZIDE  TO  IRBESARTAN-HCT 150/12.5 MG  ONCE  THIS BOTTLE IS COMPLETED PRESCRIPTION WAS SENT TO PHARMACY.  If you need a refill on your cardiac medications before your next appointment, please call your pharmacy.   Lab work:  NOT NEEDED  Testing/Procedures: Not needed  Follow-Up: At [facility], you and your health needs are our priority.  As part of our continuing mission to provide you with exceptional heart care, we have created designated Provider Care Teams.  These Care Teams include your primary Cardiologist (physician) and Advanced Practice Providers (APPs -  Physician Assistants and Nurse Practitioners) who all work together to provide you with the care you need, when you need it. . You will need a follow up appointment in 10 months-June 2021.  Please call our office 2 months in advance to schedule this appointment.  You may see Glenetta Hew, MD or one of the following Advanced Practice Providers on your designated Care Team:   . Rosaria Ferries, PA-C . Jory Sims, DNP, ANP  Any Other Special Instructions Will Be Listed Below (If Applicable).

## 2018-11-02 NOTE — Progress Notes (Signed)
PCP: Burnard Bunting, MD  Clinic Note: Chief Complaint  Patient presents with   Follow-up    12 months.   Shortness of Breath    HPI: Diana Davidson is a 68 y.o. female with a PMH notable for PSVT who presents today for delayed annual f/u - delayed 2/2 COVID. She has intermediate risk rest test back in 2015 and had a cardiac cath showing nonobstructive CAD.   Diana Davidson was last seen in March 2019. -Overall doing pretty well.  No changes made.  -- started on Metformin by PCP since prior visit  Recent Hospitalizations: n/a  Studies Reviewed: none  Interval History: Diana Davidson presents today pretty much doing well.  She says that when she walks for long period time her legs will start aching and cramping, but not limiting her activity.  She also off and on have cramping in her toes. She has her same baseline dyspnea that happens with both rest and exertion --> but does acknowledge that she is quite deconditioned and has not been elevated just her weight..  Some mild orthopnea mostly because of body habitus, but no PND.  Minimal edema, but does have some varicose veins and some purplish discoloration of her feet..  As far as her cardiac issues go she really has not had that much in the way of palpitations.  They seem to be pretty well controlled.  The current dose of metoprolol seems to be working well.  No chest pain or pressure with rest or exertion.  No syncope/near syncope or TIA/amaurosis fugax.  Cardiovascular ROS: positive for - dyspnea on exertion and Rare palpitations, mild leg swelling.  Notes deconditioning and obesity negative for - chest pain, edema, irregular heartbeat, orthopnea, paroxysmal nocturnal dyspnea, rapid heart rate or Syncope/near syncope, TIA shows amorous fugax  ROS: A comprehensive was performed. Review of Systems  Constitutional: Negative for chills, fever, malaise/fatigue (More lack of motivation then fatigue.) and weight loss.  HENT: Negative for  congestion and nosebleeds.   Eyes: Negative for blurred vision.  Respiratory: Negative for cough and shortness of breath.   Cardiovascular: Negative for leg swelling.       Per history of present illness  Gastrointestinal: Negative for blood in stool and melena.  Genitourinary: Negative for hematuria.       Nocturia - depending on how much she drinks  Musculoskeletal: Positive for joint pain (Arthritis pains) and myalgias (Legs ache at the end of walking.).       Toe cramping  Neurological: Negative for dizziness, focal weakness, weakness and headaches.  Psychiatric/Behavioral: Negative for depression and memory loss. The patient has insomnia.   All other systems reviewed and are negative.  The patient does not have symptoms concerning for COVID-19 infection (fever, chills, cough, or new shortness of breath).  The patient is practicing social distancing.   COVID-19 Education: The signs and symptoms of COVID-19 were discussed with the patient and how to seek care for testing (follow up with PCP or arrange E-visit).   The importance of social distancing was discussed today.   Past Medical History:  Diagnosis Date   Abnormal nuclear stress test    False +; Cardiac cath with no obstructive CAD.   Anemia    Anxiety    hx panic attacks - none for several years   Arthritis    Colon cancer [facility])    April 2015: LAP Sigmoidectomy (Dr. Zella Richer)    Diabetes mellitus without complication ([place]) 09/8293   HgbA1c 6  -  Borderline diabetes   Fibromyalgia    GERD (gastroesophageal reflux disease)    Heart palpitations    PSVT, PVCs, PACS - all short bursts, no syncope   Hyperlipidemia    Hypertension    Morbid obesity with BMI of 45.0-49.9, adult (HCC)    Neuromuscular disorder ([place])    Osteopenia    PSVT (paroxysmal supraventricular tachycardia) ([place]) 02/2011 and 03/2011   Wore a MONITOR=brief run of psvt that are very short, as well a frequent PACs and sinus tachycardia      Stress incontinence     Past Surgical History:  Procedure Laterality Date   BUNIONECTOMY     LEFT   DOPPLER ECHOCARDIOGRAPHY  03/03/2011   EF greater than 55; Moderate concentric LVH, grade 1 1 diastolic dysfunction with mildly elevated LVEDP/LAP; RV pressure 30-40 mmHg;    LAPAROSCOPIC PARTIAL COLECTOMY N/A 07/05/2013   Procedure: LAPAROSCOPIC ASSISTED SIGMOID COLECTOMY AND PROCTOSCOPY;  Surgeon: Odis Hollingshead, MD;  Location: [facility] ORS;  Service: General;  Laterality: N/A;   LEFT HEART CATHETERIZATION WITH CORONARY ANGIOGRAM N/A 05/22/2013   Procedure: LEFT HEART CATHETERIZATION WITH CORONARY ANGIOGRAM;  Surgeon: Leonie Man, MD;  Location: [facility] CATH LAB;  Service: Cardiovascular: Non-obstructive Disease - False + Nuc ST:   NM MYOVIEW LTD  05/15/2013   INTERMEDIATE RISK - small area of inferior reversibility suggestive of possible ischemia. Cannot exclude bowel artifact.   NM PET IMG ALZHEIMERS     SHOULDER SURGERY      Current Meds  Medication Sig   acetaminophen (TYLENOL) 500 MG tablet Take 500 mg by mouth every 6 (six) hours as needed for moderate pain.   Calcium Citrate-Vitamin D (CALCIUM CITRATE + D3 PO) Take 2 tablets by mouth daily.    Cholecalciferol (VITAMIN D3) 2000 units TABS Take 2 tablets by mouth daily.   Coenzyme Q10 (CO Q 10) 100 MG CAPS Take 100 mg by mouth daily.    DULoxetine (CYMBALTA) 60 MG capsule Take 60 mg by mouth every morning.    esomeprazole (NEXIUM) 40 MG capsule Take 40 mg by mouth daily before breakfast.   ibuprofen (ADVIL,MOTRIN) 200 MG tablet Take 400 mg by mouth every 6 (six) hours as needed for moderate pain.   LORazepam (ATIVAN) 1 MG tablet Take 1 mg by mouth 3 (three) times daily.    metFORMIN (GLUCOPHAGE) 500 MG tablet Take 500 mg by mouth 2 (two) times daily.   metoprolol tartrate (LOPRESSOR) 50 MG tablet Take 1 tablet (50 mg total) by mouth 2 (two) times daily.   simvastatin (ZOCOR) 20 MG tablet Take 20 mg by mouth every  evening.   [DISCONTINUED] hydrochlorothiazide (HYDRODIURIL) 25 MG tablet Take 1 tablet (25 mg total) by mouth daily.   [DISCONTINUED] metoprolol tartrate (LOPRESSOR) 50 MG tablet TAKE 1 TABLET BY MOUTH TWICE A DAY   [DISCONTINUED] metoprolol tartrate (LOPRESSOR) 50 MG tablet TAKE 1 TABLET BY MOUTH TWICE A DAY    Allergies  Allergen Reactions   Dilaudid [Hydromorphone Hcl] Other (See Comments)    hallucination   Novocain [Procaine]     Heart palpitations    Prednisone Other (See Comments)    Red flush area across her face   Sulfa Antibiotics Other (See Comments)    Sore in top of mouth   Penicillins Rash    Social History   Tobacco Use   Smoking status: Never Smoker   Smokeless tobacco: Never Used  Substance Use Topics   Alcohol use: No   Drug use: No  Social History   Social History Narrative   She is a single woman. 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 She does walk, walking her   dog, but has been bothered recently by her knee. She does not smoke, does not drink. She walks her dog about 4-5 days a week and does this for about 30 minutes a day.     family history includes Alzheimer's disease in her mother; Cancer in her paternal aunt; Diabetes in her mother; Hypertension in her maternal grandfather, maternal grandmother, and mother; Stroke in her maternal grandfather and paternal grandmother.  Wt Readings from Last 3 Encounters:  11/02/18 251 lb (113.9 kg)  05/20/17 252 lb 12.8 oz (114.7 kg)  05/14/16 257 lb (116.6 kg)    PHYSICAL EXAM BP 122/80 (BP Location: Left Arm, Patient Position: Sitting, Cuff Size: Large)    Pulse 79    Temp (!) 97.2 F (36.2 C)    Ht 5\' 2"  (1.575 m)    Wt 251 lb (113.9 kg)    BMI 45.91 kg/m -  Physical Exam  Constitutional: She appears well-developed and well-nourished. No distress.  Well-groomed.  Morbidly obese.  HENT:  Head: Normocephalic and atraumatic.  Eyes: EOM are normal.  Neck: No hepatojugular reflux and no JVD  present. Carotid bruit is not present.  Cardiovascular: Normal rate, regular rhythm, S1 normal, S2 normal, intact distal pulses and normal pulses.  No extrasystoles are present. PMI is not displaced (Unable to palpate). Exam reveals distant heart sounds (Somewhat distant due to body habitus). Exam reveals no gallop and no friction rub.  No murmur heard. Pulmonary/Chest: Effort normal and breath sounds normal. No respiratory distress. She has no wheezes. She has no rales.  Mostly distant breath sounds due to body habitus  Abdominal: Soft. Bowel sounds are normal. She exhibits no distension. There is no abdominal tenderness. There is no rebound.  Musculoskeletal: Normal range of motion.        General: No edema (Right calf has a peripheral varicose vein that is somewhat engorged.).  Neurological: She is alert.  Skin:  Both feet mostly from the mid feet to the toes have reddish-purple discoloration.  Psychiatric: She has a normal mood and affect. Her behavior is normal. Judgment and thought content normal.  Nursing note and vitals reviewed.   Adult ECG Report  Rate: 79;  Rhythm: normal sinus rhythm and Cannot exclude inferior MI, age undetermined. Otherwise normal axis, intervals and durations.;  Borderline low voltage  Narrative Interpretation: stable EKG  Other studies Reviewed: Additional studies/ records that were reviewed today include:  Recent Labs:  Labs checked by PCP. - due in Sept 2020 --As of last September triglycerides 139 and LDL 81, HDL 53 and total cholesterol 162.  A1c 7.3.  ASSESSMENT / PLAN: Problem List Items Addressed This Visit    PSVT (paroxysmal supraventricular tachycardia) - history of - Primary (Chronic)    No breakthrough episodes.  Reiterated vagal maneuvers.  Continue beta-blocker.      Relevant Medications   metoprolol tartrate (LOPRESSOR) 50 MG tablet   irbesartan-hydrochlorothiazide (AVALIDE) 150-12.5 MG tablet   Other Relevant Orders   EKG 12-Lead  (Completed)   Morbid obesity with BMI of 45.0-49.9, adult (HCC) (Chronic)    Unfortunately, she seems to have put back on the weight that she had previously lost.  A lot of this has to do with lack of motivation to exercise, and then 3 months of quarantining from COVID.  I encouraged her to get  back into exercising trying to do some walking regardless of how much it is even it is walking around the house.  Also adjusting diet.  Especially with her now having been started on metformin for diabetes.      Metabolic syndrome: Obesity, hypertension, diabetes (Chronic)    Stressed the importance of maintaining adequate control of risk factors with hypertension, hyperlipidemia, diabetes and obesity.  She meets criteria for metabolic syndrome which does put her at high risk for CAD. She is on statin with relatively stable lipids. Blood pressures controlled.  Started on medications for diabetes, but now the major issue remains weight control.      Relevant Orders   EKG 12-Lead (Completed)   Heart palpitations (Chronic)    Well-controlled on current dose of beta-blocker.      Exertional dyspnea (Chronic)    Probably multifactorial, but most likely related to weight and deconditioning.  Continue to monitor, and if symptoms worsen, would warrant ischemic evaluation --> if this were going to be done, would probably opt for coronary CT angiogram over nuclear stress test.      Essential hypertension (Chronic)    Blood pressure looks great on current dose of beta-blocker and ARB-HCTZ which is also helping with mild diuresis..      Relevant Medications   metoprolol tartrate (LOPRESSOR) 50 MG tablet   irbesartan-hydrochlorothiazide (AVALIDE) 150-12.5 MG tablet   Dyslipidemia, goal LDL below 130 but closer to 100 (Chronic)   Relevant Medications   metoprolol tartrate (LOPRESSOR) 50 MG tablet   irbesartan-hydrochlorothiazide (AVALIDE) 150-12.5 MG tablet      Current medicines are reviewed at length  with the patient today. (+/- concerns)  --She has issues with losartan, currently being told by her pharmacy that they do not have availability of her medication. The following changes have been made:Converting from losartan-HCTZ to irbesartan-HCTZ  Patient Instructions  Medication Instructions:     MAY CHANGE LOSARTAN AND HYDROCHLOROTHIAZIDE  TO  IRBESARTAN-HCT 150/12.5 MG  ONCE  THIS BOTTLE IS COMPLETED PRESCRIPTION WAS SENT TO PHARMACY.  If you need a refill on your cardiac medications before your next appointment, please call your pharmacy.   Lab work:  NOT NEEDED  Testing/Procedures: Not needed  Follow-Up: At [facility], you and your health needs are our priority.  As part of our continuing mission to provide you with exceptional heart care, we have created designated Provider Care Teams.  These Care Teams include your primary Cardiologist (physician) and Advanced Practice Providers (APPs -  Physician Assistants and Nurse Practitioners) who all work together to provide you with the care you need, when you need it.  You will need a follow up appointment in 10 months-June 2021.  Please call our office 2 months in advance to schedule this appointment.  You may see Glenetta Hew, MD or one of the following Advanced Practice Providers on your designated Care Team:    Rosaria Ferries, PA-C  Jory Sims, DNP, ANP  Any Other Special Instructions Will Be Listed Below (If Applicable).   Studies Ordered:   Orders Placed This Encounter  Procedures   EKG 12-Lead      Glenetta Hew, M.D., M.S. Interventional Cardiologist   Pager # [phone] Phone # [phone] [address]  [place], [place] [address]

## 2018-11-04 ENCOUNTER — Encounter: Payer: Self-pay | Admitting: Cardiology

## 2018-11-04 NOTE — Assessment & Plan Note (Signed)
Probably multifactorial, but most likely related to weight and deconditioning.  Continue to monitor, and if symptoms worsen, would warrant ischemic evaluation --> if this were going to be done, would probably opt for coronary CT angiogram over nuclear stress test.

## 2018-11-04 NOTE — Assessment & Plan Note (Signed)
Stressed the importance of maintaining adequate control of risk factors with hypertension, hyperlipidemia, diabetes and obesity.  She meets criteria for metabolic syndrome which does put her at high risk for CAD. She is on statin with relatively stable lipids. Blood pressures controlled.  Started on medications for diabetes, but now the major issue remains weight control.

## 2018-11-04 NOTE — Assessment & Plan Note (Signed)
Well-controlled on current dose of beta-blocker. 

## 2018-11-04 NOTE — Assessment & Plan Note (Signed)
Blood pressure looks great on current dose of beta-blocker and ARB-HCTZ which is also helping with mild diuresis.Diana Davidson

## 2018-11-04 NOTE — Assessment & Plan Note (Signed)
Unfortunately, she seems to have put back on the weight that she had previously lost.  A lot of this has to do with lack of motivation to exercise, and then 3 months of quarantining from [place].  I encouraged her to get back into exercising trying to do some walking regardless of how much it is even it is walking around the house.  Also adjusting diet.  Especially with her now having been started on metformin for diabetes.

## 2018-11-04 NOTE — Assessment & Plan Note (Signed)
No breakthrough episodes.  Reiterated vagal maneuvers.  Continue beta-blocker.

## 2018-11-08 DIAGNOSIS — R69 Illness, unspecified: Secondary | ICD-10-CM | POA: Diagnosis not present

## 2018-12-06 DIAGNOSIS — R69 Illness, unspecified: Secondary | ICD-10-CM | POA: Diagnosis not present

## 2018-12-19 DIAGNOSIS — E1169 Type 2 diabetes mellitus with other specified complication: Secondary | ICD-10-CM | POA: Diagnosis not present

## 2018-12-19 DIAGNOSIS — E7849 Other hyperlipidemia: Secondary | ICD-10-CM | POA: Diagnosis not present

## 2018-12-19 DIAGNOSIS — M859 Disorder of bone density and structure, unspecified: Secondary | ICD-10-CM | POA: Diagnosis not present

## 2018-12-25 DIAGNOSIS — E1169 Type 2 diabetes mellitus with other specified complication: Secondary | ICD-10-CM | POA: Diagnosis not present

## 2018-12-25 DIAGNOSIS — M1990 Unspecified osteoarthritis, unspecified site: Secondary | ICD-10-CM | POA: Diagnosis not present

## 2018-12-25 DIAGNOSIS — Z0000 Encounter for general adult medical examination without abnormal findings: Secondary | ICD-10-CM | POA: Diagnosis not present

## 2018-12-25 DIAGNOSIS — M19049 Primary osteoarthritis, unspecified hand: Secondary | ICD-10-CM | POA: Diagnosis not present

## 2018-12-25 DIAGNOSIS — K219 Gastro-esophageal reflux disease without esophagitis: Secondary | ICD-10-CM | POA: Diagnosis not present

## 2018-12-25 DIAGNOSIS — M8580 Other specified disorders of bone density and structure, unspecified site: Secondary | ICD-10-CM | POA: Diagnosis not present

## 2018-12-25 DIAGNOSIS — E785 Hyperlipidemia, unspecified: Secondary | ICD-10-CM | POA: Diagnosis not present

## 2018-12-25 DIAGNOSIS — M797 Fibromyalgia: Secondary | ICD-10-CM | POA: Diagnosis not present

## 2018-12-25 DIAGNOSIS — N393 Stress incontinence (female) (male): Secondary | ICD-10-CM | POA: Diagnosis not present

## 2018-12-25 DIAGNOSIS — I10 Essential (primary) hypertension: Secondary | ICD-10-CM | POA: Diagnosis not present

## 2018-12-26 DIAGNOSIS — R82998 Other abnormal findings in urine: Secondary | ICD-10-CM | POA: Diagnosis not present

## 2018-12-26 DIAGNOSIS — I10 Essential (primary) hypertension: Secondary | ICD-10-CM | POA: Diagnosis not present

## 2018-12-26 DIAGNOSIS — E1169 Type 2 diabetes mellitus with other specified complication: Secondary | ICD-10-CM | POA: Diagnosis not present

## 2018-12-28 DIAGNOSIS — Z1212 Encounter for screening for malignant neoplasm of rectum: Secondary | ICD-10-CM | POA: Diagnosis not present

## 2019-01-03 DIAGNOSIS — K921 Melena: Secondary | ICD-10-CM | POA: Diagnosis not present

## 2019-01-03 DIAGNOSIS — K219 Gastro-esophageal reflux disease without esophagitis: Secondary | ICD-10-CM | POA: Diagnosis not present

## 2019-01-03 DIAGNOSIS — Z85038 Personal history of other malignant neoplasm of large intestine: Secondary | ICD-10-CM | POA: Diagnosis not present

## 2019-01-26 DIAGNOSIS — K921 Melena: Secondary | ICD-10-CM | POA: Diagnosis not present

## 2019-02-05 DIAGNOSIS — R69 Illness, unspecified: Secondary | ICD-10-CM | POA: Diagnosis not present

## 2019-03-30 DIAGNOSIS — E1169 Type 2 diabetes mellitus with other specified complication: Secondary | ICD-10-CM | POA: Diagnosis not present

## 2019-06-08 ENCOUNTER — Other Ambulatory Visit: Payer: Self-pay | Admitting: Cardiology

## 2019-09-02 ENCOUNTER — Other Ambulatory Visit: Payer: Self-pay | Admitting: Cardiology

## 2019-10-26 ENCOUNTER — Other Ambulatory Visit: Payer: Self-pay | Admitting: Cardiology

## 2019-11-08 ENCOUNTER — Ambulatory Visit (INDEPENDENT_AMBULATORY_CARE_PROVIDER_SITE_OTHER): Payer: Medicare HMO | Admitting: Cardiology

## 2019-11-08 ENCOUNTER — Other Ambulatory Visit: Payer: Self-pay

## 2019-11-08 ENCOUNTER — Encounter: Payer: Self-pay | Admitting: Cardiology

## 2019-11-08 VITALS — BP 120/82 | HR 76 | Ht 62.0 in | Wt 245.4 lb

## 2019-11-08 DIAGNOSIS — R002 Palpitations: Secondary | ICD-10-CM | POA: Diagnosis not present

## 2019-11-08 DIAGNOSIS — I10 Essential (primary) hypertension: Secondary | ICD-10-CM

## 2019-11-08 DIAGNOSIS — R0609 Other forms of dyspnea: Secondary | ICD-10-CM

## 2019-11-08 DIAGNOSIS — R0600 Dyspnea, unspecified: Secondary | ICD-10-CM

## 2019-11-08 DIAGNOSIS — I471 Supraventricular tachycardia: Secondary | ICD-10-CM | POA: Diagnosis not present

## 2019-11-08 DIAGNOSIS — Z6841 Body Mass Index (BMI) 40.0 and over, adult: Secondary | ICD-10-CM

## 2019-11-08 DIAGNOSIS — E785 Hyperlipidemia, unspecified: Secondary | ICD-10-CM

## 2019-11-08 MED ORDER — IRBESARTAN-HYDROCHLOROTHIAZIDE 150-12.5 MG PO TABS: 1.0000 | ORAL_TABLET | Freq: Every day | ORAL | 3 refills | Status: DC

## 2019-11-08 NOTE — Progress Notes (Signed)
Primary Care Provider: Burnard Bunting, MD Cardiologist: No primary care provider on file. Electrophysiologist: None  Clinic Note: Chief Complaint  Patient presents with  . Follow-up    Doing fairly well.  Only about 3 short bursts of fast heart rate  . Tachycardia    History of SVT    HPI:    Diana Davidson is a 69 y.o. female with a PMH notable for PSVT who presents today for annual follow-up.  Diana Davidson was last seen on November 02, 2018.  She was doing fairly well.  Noted that her legs will start aching cramping if she walks for long period of time but not limiting her activity.  Noticing some cramping in her toes.  Palpitations pretty well controlled on current dose of beta-blocker.3  Changed from losartan-HCTZ to irbesartan-HCTZ (150-12.5 mg)  Recent Hospitalizations: None  Reviewed  CV studies:    The following studies were reviewed today: (if available, images/films reviewed: From Epic Chart or Care Everywhere) . None:   Interval History:   Diana Davidson is here today overall doing pretty well she says on occasion she feels like her heart rate will go up into the 1 teens but this really happened about to 3 times in the last year.  She is taking an extra Lopressor and it usually goes away within 15 to 20 minutes.  She has been walking about a mile or so a day walking her dog.  Unfortunately, her dog just recently died.  The combination of her dog no longer being there and the heat is kept her from going out doing much exercise of the summertime.  She actually was excited to find out that she qualifies for Silver sneakers and is hoping to join a local gym where she can do this super sneakers.  She is interested in getting back more active because her A1c went up.  She is now on Trulicity and is hoping that it does better. Otherwise really doing well from cardiac standpoint.  No chest pain pressure or dyspnea with rest or exertion besides deconditioning.  No heart failure  symptoms and no syncope/near syncope.  CV Review of Symptoms (Summary) Cardiovascular ROS: no chest pain or dyspnea on exertion positive for - 3 minor episodes of rapid heart rate spells in the low 110 to 120 bpm range.  Resolve after taking as a nitroglycerin.  Was not associated with lightheadedness or dizziness.  Probably related to anxiousness. negative for - edema, irregular heartbeat, orthopnea, palpitations, paroxysmal nocturnal dyspnea, shortness of breath or Syncope/near syncope, TIA/amaurosis fugax, claudication  The patient does not have symptoms concerning for COVID-19 infection (fever, chills, cough, or new shortness of breath).  The patient is practicing social distancing & Masking.  She is vaccinated (see below)   REVIEWED OF SYSTEMS   Review of Systems  Constitutional: Positive for weight loss (She actually lost more weight when she was walking the dog more frequently.  Has gained some back now.). Negative for malaise/fatigue (Still dealing with lack of motivation, but not really fatigued).  HENT: Negative for congestion and nosebleeds.   Respiratory: Negative for shortness of breath.   Gastrointestinal: Negative for blood in stool and melena.  Genitourinary: Negative for dysuria and hematuria.  Musculoskeletal: Negative for falls, joint pain and myalgias (Less notable cramps).  Neurological: Negative for dizziness, tingling and headaches.  Psychiatric/Behavioral: Negative for depression (A little bit sad about her dog dying, but not depressed.) and memory loss. The patient is not  nervous/anxious and does not have insomnia.    Little of lack of motivation/malaise but not really fatigued.  More but lack of motivation.  Toe cramping, joint pain some myalgias.  I have reviewed and (if needed) personally updated the patient's problem list, medications, allergies, past medical and surgical history, social and family history.   PAST MEDICAL HISTORY   Past Medical History:   Diagnosis Date  . Abnormal nuclear stress test    False +; Cardiac cath with no obstructive CAD.  Marland Kitchen Anemia   . Anxiety    hx panic attacks - none for several years  . Arthritis   . Colon cancer [facility])    April 2015: LAP Sigmoidectomy (Dr. Zella Richer)   . Diabetes mellitus without complication ([place]) 0/3491   HgbA1c 6  - Borderline diabetes  . Fibromyalgia   . GERD (gastroesophageal reflux disease)   . Heart palpitations    PSVT, PVCs, PACS - all short bursts, no syncope  . Hyperlipidemia   . Hypertension   . Morbid obesity with BMI of 45.0-49.9, adult ([place])   . Neuromuscular disorder ([place])   . Osteopenia   . PSVT (paroxysmal supraventricular tachycardia) ([place]) 02/2011 and 03/2011   Wore a MONITOR=brief run of psvt that are very short, as well a frequent PACs and sinus tachycardia   . Stress incontinence     PAST SURGICAL HISTORY   Past Surgical History:  Procedure Laterality Date  . BUNIONECTOMY     LEFT  . DOPPLER ECHOCARDIOGRAPHY  03/03/2011   EF greater than 55; Moderate concentric LVH, grade 1 1 diastolic dysfunction with mildly elevated LVEDP/LAP; RV pressure 30-40 mmHg;   . LAPAROSCOPIC PARTIAL COLECTOMY N/A 07/05/2013   Procedure: LAPAROSCOPIC ASSISTED SIGMOID COLECTOMY AND PROCTOSCOPY;  Surgeon: Odis Hollingshead, MD;  Location: [facility] ORS;  Service: General;  Laterality: N/A;  . LEFT HEART CATHETERIZATION WITH CORONARY ANGIOGRAM N/A 05/22/2013   Procedure: LEFT HEART CATHETERIZATION WITH CORONARY ANGIOGRAM;  Surgeon: Leonie Man, MD;  Location: [facility] CATH LAB;  Service: Cardiovascular: Non-obstructive Disease - False + Nuc ST:  . NM MYOVIEW LTD  05/15/2013   INTERMEDIATE RISK - small area of inferior reversibility suggestive of possible ischemia. Cannot exclude bowel artifact.  Marland Kitchen NM PET IMG ALZHEIMERS    . SHOULDER SURGERY     Immunization History  Administered Date(s) Administered  . Moderna SARS-COVID-2 Vaccination 04/16/2019, 05/14/2019    MEDICATIONS/ALLERGIES    Current Meds  Medication Sig  . acetaminophen (TYLENOL) 500 MG tablet Take 500 mg by mouth every 6 (six) hours as needed for moderate pain.  . Calcium Citrate-Vitamin D (CALCIUM CITRATE + D3 PO) Take 2 tablets by mouth daily.   . Cholecalciferol (VITAMIN D3) 2000 units TABS Take 2 tablets by mouth daily.  . Coenzyme Q10 (CO Q 10) 100 MG CAPS Take 100 mg by mouth daily.   . DULoxetine (CYMBALTA) 60 MG capsule Take 60 mg by mouth every morning.   Marland Kitchen esomeprazole (NEXIUM) 40 MG capsule Take 40 mg by mouth daily before breakfast.  . ibuprofen (ADVIL,MOTRIN) 200 MG tablet Take 400 mg by mouth every 6 (six) hours as needed for moderate pain.  Marland Kitchen irbesartan-hydrochlorothiazide (AVALIDE) 150-12.5 MG tablet Take 1 tablet by mouth daily. Patient needs OV for future refills  . LORazepam (ATIVAN) 1 MG tablet Take 1 mg by mouth 3 (three) times daily.   . metFORMIN (GLUCOPHAGE) 500 MG tablet Take 500 mg by mouth 2 (two) times daily.  . metoprolol tartrate (LOPRESSOR) 50 MG  tablet TAKE 1 TABLET BY MOUTH TWICE A DAY  . simvastatin (ZOCOR) 20 MG tablet Take 20 mg by mouth every evening.  . TRULICITY 1.5 FI/4.3PI SOPN Inject 1.5 mg into the skin once a week.  . [DISCONTINUED] irbesartan-hydrochlorothiazide (AVALIDE) 150-12.5 MG tablet Take 1 tablet by mouth daily. Patient needs OV for future refills    Allergies  Allergen Reactions  . Dilaudid [Hydromorphone Hcl] Other (See Comments)    hallucination  . Novocain [Procaine]     Heart palpitations   . Prednisone Other (See Comments)    Red flush area across her face  . Sulfa Antibiotics Other (See Comments)    Sore in top of mouth  . Penicillins Rash    SOCIAL HISTORY/FAMILY HISTORY   Reviewed in Epic:  Pertinent findings: Not walking as much now because her dog died.  Does not do well out in the heat.  OBJCTIVE -PE, EKG, labs   Wt Readings from Last 3 Encounters:  11/08/19 245 lb 6.4 oz (111.3 kg)  11/02/18 251 lb (113.9 kg)  05/20/17 252 lb  12.8 oz (114.7 kg)    Physical Exam: BP 120/82   Pulse 76   Ht 5\' 2"  (1.575 m)   Wt 245 lb 6.4 oz (111.3 kg)   SpO2 98%   BMI 44.88 kg/m  Physical Exam Constitutional:      Appearance: Normal appearance. She is obese.     Comments: Morbidly obese.   Well-groomed  HENT:     Head: Normocephalic and atraumatic.  Neck:     Vascular: No carotid bruit.     Comments: No JVD or bruit Cardiovascular:     Rate and Rhythm: Normal rate and regular rhythm.     Pulses: Normal pulses.     Heart sounds: Normal heart sounds. No murmur heard.  No friction rub. No gallop.      Comments: Nondisplaced PMI.  No ectopy Pulmonary:     Effort: Pulmonary effort is normal.     Breath sounds: Normal breath sounds.  Abdominal:     General: Abdomen is flat. Bowel sounds are normal.     Palpations: Abdomen is soft.     Comments: Obese.  Unable to assess HSM  Musculoskeletal:        General: Swelling (Mild swollen varicose veins most notably on the right lower leg.) present. Normal range of motion.     Cervical back: Normal range of motion and neck supple.  Skin:    Comments: Mild venous stasis changes.  Neurological:     General: No focal deficit present.     Mental Status: She is alert and oriented to person, place, and time.  Psychiatric:        Mood and Affect: Mood normal.        Behavior: Behavior normal.        Thought Content: Thought content normal.        Judgment: Judgment normal.     Adult ECG Report  Rate: 76 ;  Rhythm: normal sinus rhythm and Cannot exclude inferior margin age-indeterminate.  Otherwise normal axis, intervals durations.;   Narrative Interpretation: Stable EKG  Recent Labs: December 19, 2018 (due to see PCP in October): TC 174, TG 201, HDL 53, LDL 81.  A1c from April 2021 was 7.2.  Most recent recheck was 6.8. No results found for: CHOL, HDL, LDLCALC, LDLDIRECT, TRIG, CHOLHDL  No results found for: TSH  ASSESSMENT/PLAN    Problem List Items Addressed This Visit  Morbid obesity with BMI of 45.0-49.9, adult ([place]) (Chronic)    She is having hard of keeping weight off.  Encouraged her to continue dietary medication but also more poorly to get back into doing her exercise.  Hopefully she will be discouraged too much of after her dog passed.  I think that getting August here for sneakers will help both increase her energy level and give her motivation because there will be actual activities to perform.  With weight loss, her blood pressure, glycemic and lipid control should improve.      Relevant Medications   TRULICITY 1.5 WE/9.9BZ SOPN   PSVT (paroxysmal supraventricular tachycardia) - history of (Chronic)    Sounds like she has had a few fast breakthrough episodes for which she is taking as needed beta-blocker.  Otherwise pretty well controlled on current dose.  Continue as needed dosing.  Reiterated vagal maneuvers.      Relevant Medications   irbesartan-hydrochlorothiazide (AVALIDE) 150-12.5 MG tablet   Other Relevant Orders   EKG 12-Lead (Completed)   Essential hypertension (Chronic)    Blood pressure looks great on current meds. On stable dose of Avalide and Lopressor.  No change.      Relevant Medications   irbesartan-hydrochlorothiazide (AVALIDE) 150-12.5 MG tablet   Other Relevant Orders   EKG 12-Lead (Completed)   Exertional dyspnea (Chronic)    Multifactorial.  Mostly related to obesity and deconditioning.  She had a negative ischemic evaluation in the past. Encouraged increased exercise.  Hopefully she will start Silver sneakers.      Dyslipidemia, goal LDL below 130 but closer to 100 (Chronic)    PCP is following her labs.  Her last LDL was actually pretty well controlled.  Triglycerides were little bit high and that probably has to do with diabetes.  Hopefully with better diabetic control, and triglycerides will improve.      Relevant Medications   irbesartan-hydrochlorothiazide (AVALIDE) 150-12.5 MG tablet   Heart  palpitations - Primary (Chronic)   Relevant Orders   EKG 12-Lead (Completed)       COVID-19 Education: The signs and symptoms of COVID-19 were discussed with the patient and how to seek care for testing (follow up with PCP or arrange E-visit).   The importance of social distancing and COVID-19 vaccination was discussed today.  I spent a total of 18 minutes with the patientin direct patient consultation.  Additional time spent with chart review  / charting (studies, outside notes, etc): 6 Total Time: 24 min   Current medicines are reviewed at length with the patient today.  (+/- concerns) none  Notice: This dictation was prepared with Dragon dictation along with smaller phrase technology. Any transcriptional errors that result from this process are unintentional and may not be corrected upon review.  Patient Instructions / Medication Changes & Studies & Tests Ordered   Patient Instructions  Medication Instructions:  No changes *If you need a refill on your cardiac medications before your next appointment, please call your pharmacy*   Lab Work: Not needed    Testing/Procedures: Not needed   Follow-Up: At [facility], you and your health needs are our priority.  As part of our continuing mission to provide you with exceptional heart care, we have created designated Provider Care Teams.  These Care Teams include your primary Cardiologist (physician) and Advanced Practice Providers (APPs -  Physician Assistants and Nurse Practitioners) who all work together to provide you with the care you need, when you need it.  We recommend  signing up for the patient portal called "MyChart".  Sign up information is provided on this After Visit Summary.  MyChart is used to connect with patients for Virtual Visits (Telemedicine).  Patients are able to view lab/test results, encounter notes, upcoming appointments, etc.  Non-urgent messages can be sent to your provider as well.   To learn more  about what you can do with MyChart, go to [email or web address].    Your next appointment:   12 month(s)  The format for your next appointment:   In Person  Provider:   Glenetta Hew, MD    Studies Ordered:   Orders Placed This Encounter  Procedures  . EKG 12-Lead     Glenetta Hew, M.D., M.S. Interventional Cardiologist   Pager # [phone] Phone # [phone] [address]. [place], [place] [address]   Thank you for choosing Heartcare at [facility]!!

## 2019-11-08 NOTE — Patient Instructions (Signed)

## 2019-11-15 ENCOUNTER — Encounter: Payer: Self-pay | Admitting: Cardiology

## 2019-11-15 NOTE — Assessment & Plan Note (Signed)
She is having hard of keeping weight off.  Encouraged her to continue dietary medication but also more poorly to get back into doing her exercise.  Hopefully she will be discouraged too much of after her dog passed.  I think that getting August here for sneakers will help both increase her energy level and give her motivation because there will be actual activities to perform.  With weight loss, her blood pressure, glycemic and lipid control should improve.

## 2019-11-15 NOTE — Assessment & Plan Note (Signed)
PCP is following her labs.  Her last LDL was actually pretty well controlled.  Triglycerides were little bit high and that probably has to do with diabetes.  Hopefully with better diabetic control, and triglycerides will improve.

## 2019-11-15 NOTE — Assessment & Plan Note (Signed)
Multifactorial.  Mostly related to obesity and deconditioning.  She had a negative ischemic evaluation in the past. Encouraged increased exercise.  Hopefully she will start Silver sneakers.

## 2019-11-15 NOTE — Assessment & Plan Note (Signed)
Blood pressure looks great on current meds. On stable dose of Avalide and Lopressor.  No change.

## 2019-11-15 NOTE — Assessment & Plan Note (Signed)
Sounds like she has had a few fast breakthrough episodes for which she is taking as needed beta-blocker.  Otherwise pretty well controlled on current dose.  Continue as needed dosing.  Reiterated vagal maneuvers.

## 2019-11-26 ENCOUNTER — Other Ambulatory Visit: Payer: Self-pay | Admitting: Cardiology

## 2020-11-12 ENCOUNTER — Other Ambulatory Visit (HOSPITAL_COMMUNITY): Payer: Self-pay | Admitting: Physician Assistant

## 2020-11-12 ENCOUNTER — Other Ambulatory Visit: Payer: Self-pay

## 2020-11-12 ENCOUNTER — Other Ambulatory Visit: Payer: Self-pay | Admitting: Cardiology

## 2020-11-12 ENCOUNTER — Ambulatory Visit (HOSPITAL_COMMUNITY)
Admission: RE | Admit: 2020-11-12 | Discharge: 2020-11-12 | Disposition: A | Discharge status: Home / Self Care | Payer: Medicare HMO | Source: Ambulatory Visit | Attending: Internal Medicine | Admitting: Internal Medicine

## 2020-11-12 DIAGNOSIS — M79661 Pain in right lower leg: Secondary | ICD-10-CM | POA: Insufficient documentation

## 2020-11-12 DIAGNOSIS — M79604 Pain in right leg: Secondary | ICD-10-CM

## 2020-11-12 DIAGNOSIS — M79662 Pain in left lower leg: Secondary | ICD-10-CM | POA: Insufficient documentation

## 2020-11-12 DIAGNOSIS — M79605 Pain in left leg: Secondary | ICD-10-CM

## 2020-11-20 ENCOUNTER — Other Ambulatory Visit: Payer: Self-pay | Admitting: Cardiology

## 2021-01-07 ENCOUNTER — Ambulatory Visit: Payer: Medicare HMO | Admitting: Cardiology

## 2021-01-07 ENCOUNTER — Encounter: Payer: Self-pay | Admitting: Cardiology

## 2021-01-07 ENCOUNTER — Other Ambulatory Visit: Payer: Self-pay

## 2021-01-07 VITALS — BP 130/90 | HR 74 | Ht 60.0 in | Wt 233.0 lb

## 2021-01-07 DIAGNOSIS — E8881 Metabolic syndrome: Secondary | ICD-10-CM

## 2021-01-07 DIAGNOSIS — I471 Supraventricular tachycardia: Secondary | ICD-10-CM

## 2021-01-07 DIAGNOSIS — E785 Hyperlipidemia, unspecified: Secondary | ICD-10-CM

## 2021-01-07 DIAGNOSIS — Z6841 Body Mass Index (BMI) 40.0 and over, adult: Secondary | ICD-10-CM

## 2021-01-07 DIAGNOSIS — R0609 Other forms of dyspnea: Secondary | ICD-10-CM

## 2021-01-07 DIAGNOSIS — I10 Essential (primary) hypertension: Secondary | ICD-10-CM

## 2021-01-07 DIAGNOSIS — R002 Palpitations: Secondary | ICD-10-CM

## 2021-01-07 NOTE — Progress Notes (Signed)
Primary Care Provider: Burnard Bunting, MD Cardiologist: None Electrophysiologist: None  Clinic Note: Chief Complaint  Patient presents with   Follow-up    Doing well.  Blood pressure much better than it is here today.  Palpitations notably improved.  Not having to take additional dose of beta-blocker recently.     ===================================  ASSESSMENT/PLAN   Problem List Items Addressed This Visit       Cardiology Problems   PSVT (paroxysmal supraventricular tachycardia) - history of - Primary (Chronic)    Thankfully, it sounds that she has not had any prolonged episodes of breakthrough where she had to take additional dose of metoprolol.  She has had some palpitations but when the nurse and if she is not consistent with her avoiding caffeine.  Continue advancing adequately hydrated.  Continue beta-blocker. Continue PRN use of 25 to 50 mg for Palpitations Reviewed Vagal maneuvers.      Relevant Orders   EKG 12-Lead (Completed)   Essential hypertension (Chronic)    Stable blood pressure on current dose of Avalide and Lopressor      Dyslipidemia, goal LDL below 130 but closer to 100 (Chronic)    Labs being followed by PCP.  Last available labs were from December of last year.  She supposed be getting that done either December or January.  LDL was pretty well controlled, triglycerides are little elevated.  Hopefully this will improve.  She is on 20 mg simvastatin.  Seems to be tolerating it relatively well.        Other   Morbid obesity with BMI of 45.0-49.9, adult (HCC) (Chronic)    Lost 10 pounds in the past year.  Still try to exercise.  Needs to change diet.      Metabolic syndrome: Obesity, hypertension, diabetes (Chronic)    Diabetes, hyperlipidemia, obesity and hypertension.  On appropriate blood pressure medications.  Is on metformin and Trulicity.  Needs to work on weight loss.  Continue to stay active with exercising.  Dietary Modification.       Exertional dyspnea (Chronic)   Relevant Orders   EKG 12-Lead (Completed)   Heart palpitations (Chronic)    Well-controlled current dose beta-blocker.       ===================================  HPI:    Diana Davidson is a 70 y.o. female with a PMH notable for history of PSVT and HTN who presents today for > 1 year follow-up.  Diana Davidson was last seen on November 13, 2019: She was doing well.  Occasionally feeling her heart rate going to the 110 bpm range.  Maybe 2-3 times in the past year.  Each time she took an extra Lopressor and symptoms resolved within 15 to 20 minutes.  Was walking a mile a day or so while walking her dog.  Also does not go out much in the heat of summer.  Qualify for [employer] and was hoping to join the [employer] -> was upset because her A1c went back up again.  Had to started Trulicity.  Stable for cardiac standpoint.  Recent Hospitalizations: None  Reviewed  CV studies:    The following studies were reviewed today: (if available, images/films reviewed: From Epic Chart or Care Everywhere) Lower extremity venous Dopplers 11/12/2020: No DVT.:  None  Interval History:   Diana Davidson returns here today for delayed annual follow-up doing pretty well.  She said that her appointment got rescheduled, and she is doing well so she was not a lot of stress.  She  says her blood pressures always run lower at home.  The range on her blood pressure cuff recordings have been from 110/75 until 135/85 with an average about 120/80 mmHg.  She is lost about 10 pounds since last visit.  She said that even though her dog died, she has continued to walk at least the same out that she was doing with her dog.  She denies any chest pain or pressure with rest or exertion.  No exertional dyspnea.  Has not really had any more the prolonged apneic spells.  Every now and then she feels palpitations if she drinks more coffee than usual or eat sweets.  But usually nothing  significant.  CV Review of Symptoms (Summary) Cardiovascular ROS: no chest pain or dyspnea on exertion positive for - palpitations, rapid heart rate, and these are relatively well controlled now.  Also 10 pound weight loss-intentionally negative for - edema, orthopnea, paroxysmal nocturnal dyspnea, shortness of breath, or lightheadedness, dizziness or wooziness, syncope/near syncope or TIA/amaurosis fugax, claudication.  REVIEWED OF SYSTEMS   Review of Systems  Constitutional:  Positive for weight loss. Negative for malaise/fatigue.  HENT:  Negative for nosebleeds.   Respiratory:  Negative for shortness of breath.   Cardiovascular:  Negative for leg swelling.  Gastrointestinal:  Negative for blood in stool and melena.  Genitourinary:  Negative for hematuria.  Musculoskeletal:  Positive for joint pain (Mild aches and pains). Negative for myalgias.  Neurological:  Negative for dizziness, focal weakness, weakness and headaches.  All other systems reviewed and are negative.  I have reviewed and (if needed) personally updated the patient's problem list, medications, allergies, past medical and surgical history, social and family history.   PAST MEDICAL HISTORY   Past Medical History:  Diagnosis Date   Abnormal nuclear stress test    False +; Cardiac cath with no obstructive CAD.   Anemia    Anxiety    hx panic attacks - none for several years   Arthritis    Colon cancer [facility])    April 2015: LAP Sigmoidectomy (Dr. Zella Richer)    Diabetes mellitus without complication ([place]) 07/4648   HgbA1c 6  - Borderline diabetes   Fibromyalgia    GERD (gastroesophageal reflux disease)    Heart palpitations    PSVT, PVCs, PACS - all short bursts, no syncope   Hyperlipidemia    Hypertension    Morbid obesity with BMI of 45.0-49.9, adult ([place])    Neuromuscular disorder ([place])    Osteopenia    PSVT (paroxysmal supraventricular tachycardia) ([place]) 02/2011 and 03/2011   Wore a MONITOR=brief run of  psvt that are very short, as well a frequent PACs and sinus tachycardia    Stress incontinence     PAST SURGICAL HISTORY   Past Surgical History:  Procedure Laterality Date   BUNIONECTOMY     LEFT   DOPPLER ECHOCARDIOGRAPHY  03/03/2011   EF greater than 55; Moderate concentric LVH, grade 1 1 diastolic dysfunction with mildly elevated LVEDP/LAP; RV pressure 30-40 mmHg;    LAPAROSCOPIC PARTIAL COLECTOMY N/A 07/05/2013   Procedure: LAPAROSCOPIC ASSISTED SIGMOID COLECTOMY AND PROCTOSCOPY;  Surgeon: Odis Hollingshead, MD;  Location: [facility];  Service: General;  Laterality: N/A;   LEFT HEART CATHETERIZATION WITH CORONARY ANGIOGRAM N/A 05/22/2013   Procedure: LEFT HEART CATHETERIZATION WITH CORONARY ANGIOGRAM;  Surgeon: Leonie Man, MD;  Location: [facility] CATH LAB;  Service: Cardiovascular: Non-obstructive Disease - False + Nuc ST:   NM MYOVIEW LTD  05/15/2013   INTERMEDIATE RISK -  small area of inferior reversibility suggestive of possible ischemia. Cannot exclude bowel artifact.   NM PET IMG ALZHEIMERS     SHOULDER SURGERY      There is no immunization history for the selected administration types on file for this patient.   MEDICATIONS/ALLERGIES   Current Meds  Medication Sig   acetaminophen (TYLENOL) 500 MG tablet Take 500 mg by mouth every 6 (six) hours as needed for moderate pain.   Calcium Citrate-Vitamin D (CALCIUM CITRATE + D3 PO) Take 2 tablets by mouth daily.    Cholecalciferol (VITAMIN D3) 2000 units TABS Take 2 tablets by mouth daily.   Coenzyme Q10 (CO Q 10) 100 MG CAPS Take 100 mg by mouth daily.    DULoxetine (CYMBALTA) 60 MG capsule Take 60 mg by mouth every morning.    esomeprazole (NEXIUM) 40 MG capsule Take 40 mg by mouth daily before breakfast.   irbesartan-hydrochlorothiazide (AVALIDE) 150-12.5 MG tablet Take 1 tablet by mouth daily. PATIENT MUST KEEP OFFICE VISIT FOR FUTURE REFILLS   LORazepam (ATIVAN) 1 MG tablet Take 1 mg by mouth 3 (three) times daily.    metFORMIN  (GLUCOPHAGE) 500 MG tablet Take 500 mg by mouth 2 (two) times daily.   metoprolol tartrate (LOPRESSOR) 50 MG tablet TAKE 1 TABLET BY MOUTH TWICE A DAY   simvastatin (ZOCOR) 20 MG tablet Take 20 mg by mouth every evening.   TRULICITY 1.5 NG/2.9BM SOPN Inject 1.5 mg into the skin once a week.    Allergies  Allergen Reactions   Dilaudid [Hydromorphone Hcl] Other (See Comments)    hallucination   Novocain [Procaine]     Heart palpitations    Prednisone Other (See Comments)    Red flush area across her face   Sulfa Antibiotics Other (See Comments)    Sore in top of mouth   Penicillins Rash    SOCIAL HISTORY/FAMILY HISTORY   Reviewed in Epic:  Pertinent findings:  Social History   Tobacco Use   Smoking status: Never   Smokeless tobacco: Never  Substance Use Topics   Alcohol use: No   Drug use: No   Social History   Social History Narrative   She is a single woman. 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 She does walk, walking herdog, but has been bothered recently by her knee. She does not smoke, does not drink.       She still tries to walk 4-5 days a week and does this for about 30 minutes a day.     OBJCTIVE -PE, EKG, labs   Wt Readings from Last 3 Encounters:  01/07/21 233 lb (105.7 kg)  11/08/19 245 lb 6.4 oz (111.3 kg)  11/02/18 251 lb (113.9 kg)    Physical Exam: BP 130/90 (BP Location: Right Arm)   Pulse 74   Ht 5' (1.524 m)   Wt 233 lb (105.7 kg)   SpO2 98%   BMI 45.50 kg/m  Physical Exam Constitutional:      General: She is not in acute distress.    Appearance: Normal appearance. She is obese. She is not toxic-appearing.     Comments: Notable wgt loss  HENT:     Head: Normocephalic and atraumatic.  Neck:     Vascular: No carotid bruit.  Cardiovascular:     Rate and Rhythm: Normal rate and regular rhythm.     Pulses: Normal pulses.     Heart sounds: Normal heart sounds. No murmur heard.   No friction rub. No  gallop.  Pulmonary:     Effort:  Pulmonary effort is normal. No respiratory distress.     Breath sounds: Normal breath sounds.  Abdominal:     General: Bowel sounds are normal. There is no distension.     Palpations: Abdomen is soft.     Comments: obese  Musculoskeletal:        General: No swelling. Normal range of motion.     Cervical back: Normal range of motion and neck supple.  Skin:    General: Skin is warm and dry.     Comments: Mild swollen varicose veins most notable right lower leg.  Mild venous stasis changes.  Neurological:     General: No focal deficit present.     Mental Status: She is alert and oriented to person, place, and time.     Gait: Gait normal.  Psychiatric:        Mood and Affect: Mood normal.        Behavior: Behavior normal.        Thought Content: Thought content normal.        Judgment: Judgment normal.     Comments: In great spirits    Adult ECG Report  Rate: 74 ;  Rhythm: normal sinus rhythm; normal axis, intervals & durations.   Narrative Interpretation: stable  Recent Labs:   02/19/2020 -> TC 167, TG 192, HDL 57, LDL 73; Hgb 12.1, Cr 0.8 -continue to recheck soon. 11/24/2020 A1c 6.7  No results found for: CHOL, HDL, LDLCALC, LDLDIRECT, TRIG, CHOLHDL Lab Results  Component Value Date   CREATININE 0.71 07/06/2013   BUN 11 07/06/2013   NA 135 (L) 07/06/2013   K 3.6 (L) 07/06/2013   CL 96 07/06/2013   CO2 26 07/06/2013   CBC Latest Ref Rng & Units 07/06/2013 06/26/2013 05/17/2013  WBC 4.0 - 10.5 K/uL 14.4(H) 11.7(H) 11.5(H)  Hemoglobin 12.0 - 15.0 g/dL 10.0(L) 11.0(L) 10.9(L)  Hematocrit 36.0 - 46.0 % 30.4(L) 34.0(L) 33.5(L)  Platelets 150 - 400 K/uL 344 420(H) 391    No results found for: HGBA1C No results found for: TSH  ==================================================  COVID-19 Education: The signs and symptoms of COVID-19 were discussed with the patient and how to seek care for testing (follow up with PCP or arrange E-visit).    I spent a total of 19 minutes with the  patient spent in direct patient consultation.  Additional time spent with chart review  / charting (studies, outside notes, etc): 14 min Total Time: 33 min  Current medicines are reviewed at length with the patient today.  (+/- concerns) N/A  This visit occurred during the SARS-CoV-2 public health emergency.  Safety protocols were in place, including screening questions prior to the visit, additional usage of staff PPE, and extensive cleaning of exam room while observing appropriate contact time as indicated for disinfecting solutions.  Notice: This dictation was prepared with Dragon dictation along with smart phrase technology. Any transcriptional errors that result from this process are unintentional and may not be corrected upon review.  Orders Placed This Encounter  Procedures   EKG 12-Lead     Patient Instructions / Medication Changes & Studies & Tests Ordered   Patient Instructions  Medication Instructions:  No changes  *If you need a refill on your cardiac medications before your next appointment, please call your pharmacy*   Lab Work: Not needed    Testing/Procedures:  Not needed  Follow-Up: At [facility], you and your health needs are our priority.  As part of our continuing mission to provide you with exceptional heart care, we have created designated Provider Care Teams.  These Care Teams include your primary Cardiologist (physician) and Advanced Practice Providers (APPs -  Physician Assistants and Nurse Practitioners) who all work together to provide you with the care you need, when you need it.     Your next appointment:   12 month(s)  The format for your next appointment:   In Person  Provider:   Glenetta Hew, MD      Glenetta Hew, M.D., M.S. Interventional Cardiologist   Pager # [phone] Phone # [phone] [address] [place], [place] [address]   Thank you for choosing Heartcare at [facility]!!

## 2021-01-07 NOTE — Patient Instructions (Signed)

## 2021-02-04 ENCOUNTER — Other Ambulatory Visit: Payer: Self-pay | Admitting: Cardiology

## 2021-02-06 ENCOUNTER — Encounter: Payer: Self-pay | Admitting: Cardiology

## 2021-02-06 NOTE — Assessment & Plan Note (Signed)
Thankfully, it sounds that she has not had any prolonged episodes of breakthrough where she had to take additional dose of metoprolol.  She has had some palpitations but when the nurse and if she is not consistent with her avoiding caffeine.  Continue advancing adequately hydrated.  Continue beta-blocker. Continue PRN use of 25 to 50 mg for Palpitations Reviewed Vagal maneuvers.

## 2021-02-06 NOTE — Assessment & Plan Note (Signed)
Labs being followed by PCP.  Last available labs were from December of last year.  She supposed be getting that done either December or January.  LDL was pretty well controlled, triglycerides are little elevated.  Hopefully this will improve.  She is on 20 mg simvastatin.  Seems to be tolerating it relatively well.

## 2021-02-06 NOTE — Assessment & Plan Note (Signed)
Well-controlled current dose beta-blocker.

## 2021-02-06 NOTE — Assessment & Plan Note (Signed)
Lost 10 pounds in the past year.  Still try to exercise.  Needs to change diet.

## 2021-02-06 NOTE — Assessment & Plan Note (Signed)
Stable blood pressure on current dose of Avalide and Lopressor

## 2021-02-06 NOTE — Assessment & Plan Note (Signed)
Diabetes, hyperlipidemia, obesity and hypertension.  On appropriate blood pressure medications.  Is on metformin and Trulicity.  Needs to work on weight loss.  Continue to stay active with exercising.  Dietary Modification.

## 2021-02-15 ENCOUNTER — Other Ambulatory Visit: Payer: Self-pay | Admitting: Cardiology

## 2021-06-08 ENCOUNTER — Other Ambulatory Visit (HOSPITAL_COMMUNITY): Payer: Self-pay | Admitting: Internal Medicine

## 2021-06-08 ENCOUNTER — Ambulatory Visit (HOSPITAL_COMMUNITY)
Admission: RE | Admit: 2021-06-08 | Discharge: 2021-06-08 | Disposition: A | Discharge status: Home / Self Care | Payer: Medicare HMO | Source: Ambulatory Visit | Attending: Cardiovascular Disease | Admitting: Cardiovascular Disease

## 2021-06-08 ENCOUNTER — Other Ambulatory Visit: Payer: Self-pay

## 2021-06-08 DIAGNOSIS — M79605 Pain in left leg: Secondary | ICD-10-CM | POA: Diagnosis not present

## 2021-06-08 DIAGNOSIS — M79604 Pain in right leg: Secondary | ICD-10-CM | POA: Insufficient documentation

## 2021-06-08 DIAGNOSIS — M7989 Other specified soft tissue disorders: Secondary | ICD-10-CM | POA: Diagnosis not present

## 2022-01-15 ENCOUNTER — Ambulatory Visit: Payer: Medicare HMO | Attending: Cardiology | Admitting: Cardiology

## 2022-01-15 ENCOUNTER — Encounter: Payer: Self-pay | Admitting: Cardiology

## 2022-01-15 VITALS — BP 115/62 | HR 70 | Ht 61.5 in | Wt 238.0 lb

## 2022-01-15 DIAGNOSIS — R002 Palpitations: Secondary | ICD-10-CM | POA: Diagnosis not present

## 2022-01-15 DIAGNOSIS — I471 Supraventricular tachycardia, unspecified: Secondary | ICD-10-CM

## 2022-01-15 DIAGNOSIS — I10 Essential (primary) hypertension: Secondary | ICD-10-CM | POA: Diagnosis not present

## 2022-01-15 DIAGNOSIS — Z6841 Body Mass Index (BMI) 40.0 and over, adult: Secondary | ICD-10-CM

## 2022-01-15 DIAGNOSIS — E785 Hyperlipidemia, unspecified: Secondary | ICD-10-CM

## 2022-01-15 DIAGNOSIS — E8881 Metabolic syndrome: Secondary | ICD-10-CM | POA: Diagnosis not present

## 2022-01-15 DIAGNOSIS — R0609 Other forms of dyspnea: Secondary | ICD-10-CM

## 2022-01-15 MED ORDER — METOPROLOL TARTRATE 50 MG PO TABS: 50.0000 mg | ORAL_TABLET | Freq: Two times a day (BID) | ORAL | 3 refills | Status: DC

## 2022-01-15 MED ORDER — IRBESARTAN-HYDROCHLOROTHIAZIDE 150-12.5 MG PO TABS: 1.0000 | ORAL_TABLET | Freq: Every day | ORAL | 3 refills | Status: DC

## 2022-01-15 NOTE — Patient Instructions (Addendum)
Medication Instructions:   No changes Medication refilled   *If you need a refill on your cardiac medications before your next appointment, please call your pharmacy*   Lab Work:  Not needed     Testing/Procedures:  Not needed  Follow-Up: At [facility], you and your health needs are our priority.  As part of our continuing mission to provide you with exceptional heart care, we have created designated Provider Care Teams.  These Care Teams include your primary Cardiologist (physician) and Advanced Practice Providers (APPs -  Physician Assistants and Nurse Practitioners) who all work together to provide you with the care you need, when you need it.  We recommend signing up for the patient portal called "MyChart".  Sign up information is provided on this After Visit Summary.  MyChart is used to connect with patients for Virtual Visits (Telemedicine).  Patients are able to view lab/test results, encounter notes, upcoming appointments, etc.  Non-urgent messages can be sent to your provider as well.   To learn more about what you can do with MyChart, go to [email or web address].    Your next appointment:   12 month(s)  The format for your next appointment:   In Person  Provider:   Glenetta Hew, MD

## 2022-01-15 NOTE — Progress Notes (Signed)
Primary Care Provider: Burnard Bunting, MD [place] Cardiologist: Glenetta Hew, MD Electrophysiologist: None  Clinic Note: Chief Complaint  Patient presents with   Follow-up    Annual follow-up-doing well.   Palpitations    Well-controlled on current dose of beta-blocker.   ===================================  ASSESSMENT/PLAN   Problem List Items Addressed This Visit       Cardiology Problems   Dyslipidemia, goal LDL below 130 but closer to 100 (Chronic)    Most recent LDL was 92 which is well controlled on current dose of Zocor.  Low threshold to convert to either atorvastatin or rosuvastatin for better control and less side effect profile.      Relevant Medications   irbesartan-hydrochlorothiazide (AVALIDE) 150-12.5 MG tablet   metoprolol tartrate (LOPRESSOR) 50 MG tablet   Essential hypertension (Chronic)    Blood pressure looks good on current meds. Continue Avalide and Lopressor at current doses.      Relevant Medications   irbesartan-hydrochlorothiazide (AVALIDE) 150-12.5 MG tablet   metoprolol tartrate (LOPRESSOR) 50 MG tablet   PSVT (paroxysmal supraventricular tachycardia) - history of (Chronic)    Palpitations seem pretty well controlled on current dose of Lopressor.  No breakthrough episodes.  No requirement of additional beta-blocker.  Reiterated the importance of hydration as well as vagal maneuvers.      Relevant Medications   irbesartan-hydrochlorothiazide (AVALIDE) 150-12.5 MG tablet   metoprolol tartrate (LOPRESSOR) 50 MG tablet   Other Relevant Orders   EKG 12-Lead (Completed)     Other   Metabolic syndrome: Obesity, hypertension, diabetes - Primary (Chronic)    Major retractor modification and is now with diet and exercise leading to weight loss.  Lipids are pretty well controlled, blood pressure is also pretty well controlled.  Also close glycemic control.  She is on metformin and Trulicity.      Relevant Orders   EKG 12-Lead  (Completed)   Morbid obesity with BMI of 45.0-49.9, adult ([place]) (Chronic)    She had lost COVID awake, now back up some.  She is on Trulicity, may want to consider switching to Ozempic or Mounjaro for better weight loss.      Exertional dyspnea (Chronic)    Multifactorial: Mostly related to obesity and deconditioning.  Has had negative ischemic evaluation in the past.  Continue to recommend diet, exercise and weight loss.  He has been doing really well with this but has fallen back some.  Knee pain is kept her from exercising.      Heart palpitations (Chronic)    Well-controlled on current dose of Lopressor.  Not requiring additional dosing.  No change.       ===================================  HPI:    Diana Davidson is a morbidly obese 71 y.o. female with a PMH notable for history of short runs of PSVT and HTN who presents today for annual follow-up the request of Burnard Bunting, MD.  Diana Davidson was last seen on January 07, 2021 for annual follow-up.  She is doing very well.  She had a lot of lots of stress but otherwise was doing fine.  Blood pressure has been elevated, but was usually better at home in the 110/75 up to 135/85 mmHg record range.  She had lost 10 pounds-continue to walk despite the fact that her dog died.  No anginal symptoms.  Deconditioned with expected exertional dyspnea but nothing worse.  Only noted palpitations with extra caffeine.  Recent Hospitalizations: None  Reviewed  CV studies:    The  following studies were reviewed today: (if available, images/films reviewed: From Epic Chart or Care Everywhere) Left Lower Extremity Venous Dopplers: Note date for superficial DVT.  No popliteal cystic structure.  Interval History:   Diana Davidson presents here today for follow-up doing well.  No major issues.  She has good control of the role of church pressure.  In doing so she is feeling a little more field and happy.  She unfortunately has gained some weight back  but is still trying to exercise.  She "got off the 'bandwagon'"-and has not been exercising as much as she would like. It is not associated with any exertional dyspnea with the exception of deconditioning related fatigue and dyspnea.  No PND, orthopnea or edema.  No chest pain or pressure with rest or exertion.  Palpitations are pretty well controlled.  She has not had to use any additional doses of metoprolol.  Blood pressures also been well controlled.  No syncope or near syncope, TIA or amaurosis fugax.  No claudication.   REVIEWED OF SYSTEMS   Review of Systems  Constitutional:  Negative for malaise/fatigue and weight loss (Unfortunately, she gained some back).  HENT:  Negative for congestion and nosebleeds.   Respiratory:  Negative for cough and shortness of breath (Only with overexertion).   Cardiovascular: Negative.        Per HPI  Gastrointestinal:  Negative for abdominal pain, blood in stool and melena.  Genitourinary:  Negative for dysuria and hematuria.  Musculoskeletal:  Positive for joint pain (Mostly knees-left greater than right).  Neurological:  Negative for dizziness, focal weakness and weakness.  Endo/Heme/Allergies:  Does not bruise/bleed easily.  Psychiatric/Behavioral: Negative.    All other systems reviewed and are negative.  I have reviewed and (if needed) personally updated the patient's problem list, medications, allergies, past medical and surgical history, social and family history.   PAST MEDICAL HISTORY   Past Medical History:  Diagnosis Date   Abnormal nuclear stress test    False +; Cardiac cath with no obstructive CAD.   Anemia    Anxiety    hx panic attacks - none for several years   Arthritis    Colon cancer [facility])    April 2015: LAP Sigmoidectomy (Dr. Zella Richer)    Diabetes mellitus without complication ([place]) 0/9811   HgbA1c 6  - Borderline diabetes   Fibromyalgia    GERD (gastroesophageal reflux disease)    Heart palpitations    PSVT, PVCs,  PACS - all short bursts, no syncope   Hyperlipidemia    Hypertension    Morbid obesity with BMI of 45.0-49.9, adult ([place])    Neuromuscular disorder ([place])    Osteopenia    PSVT (paroxysmal supraventricular tachycardia) 02/2011 and 03/2011   Wore a MONITOR=brief run of psvt that are very short, as well a frequent PACs and sinus tachycardia    Stress incontinence     PAST SURGICAL HISTORY   Past Surgical History:  Procedure Laterality Date   BUNIONECTOMY     LEFT   DOPPLER ECHOCARDIOGRAPHY  03/03/2011   EF greater than 55; Moderate concentric LVH, grade 1 1 diastolic dysfunction with mildly elevated LVEDP/LAP; RV pressure 30-40 mmHg;    LAPAROSCOPIC PARTIAL COLECTOMY N/A 07/05/2013   Procedure: LAPAROSCOPIC ASSISTED SIGMOID COLECTOMY AND PROCTOSCOPY;  Surgeon: Odis Hollingshead, MD;  Location: [facility] ORS;  Service: General;  Laterality: N/A;   LEFT HEART CATHETERIZATION WITH CORONARY ANGIOGRAM N/A 05/22/2013   Procedure: LEFT HEART CATHETERIZATION WITH CORONARY ANGIOGRAM;  Surgeon:  Leonie Man, MD;  Location: [facility] CATH LAB;  Service: Cardiovascular: Non-obstructive Disease - False + Nuc ST:   NM MYOVIEW LTD  05/15/2013   INTERMEDIATE RISK - small area of inferior reversibility suggestive of possible ischemia. Cannot exclude bowel artifact.   NM PET IMG ALZHEIMERS     SHOULDER SURGERY      Immunization History  Administered Date(s) Administered   Moderna Sars-Covid-2 Vaccination 04/16/2019, 05/14/2019    MEDICATIONS/ALLERGIES   Current Meds  Medication Sig   acetaminophen (TYLENOL) 500 MG tablet Take 500 mg by mouth every 6 (six) hours as needed for moderate pain.   Calcium Citrate-Vitamin D (CALCIUM CITRATE + D3 PO) Take 2 tablets by mouth daily.    Cholecalciferol (VITAMIN D3) 2000 units TABS Take 2 tablets by mouth daily.   Coenzyme Q10 (CO Q 10) 100 MG CAPS Take 100 mg by mouth daily.    DULoxetine (CYMBALTA) 60 MG capsule Take 60 mg by mouth every morning.    esomeprazole  (NEXIUM) 40 MG capsule Take 40 mg by mouth daily before breakfast.   ibuprofen (ADVIL,MOTRIN) 200 MG tablet Take 400 mg by mouth every 6 (six) hours as needed for moderate pain.   iron polysaccharides (NIFEREX) 150 MG capsule Take 150 mg by mouth daily.   LORazepam (ATIVAN) 1 MG tablet Take 1 mg by mouth 3 (three) times daily.    metFORMIN (GLUCOPHAGE) 500 MG tablet Take 500 mg by mouth 2 (two) times daily.   simvastatin (ZOCOR) 20 MG tablet Take 20 mg by mouth every evening.   TRULICITY 1.5 AU/6.3FH SOPN Inject 1.5 mg into the skin once a week.   [DISCONTINUED] irbesartan-hydrochlorothiazide (AVALIDE) 150-12.5 MG tablet Take 1 tablet by mouth daily.   [DISCONTINUED] metoprolol tartrate (LOPRESSOR) 50 MG tablet Take 1 tablet (50 mg total) by mouth 2 (two) times daily.    Allergies  Allergen Reactions   Dilaudid [Hydromorphone Hcl] Other (See Comments)    hallucination   Novocain [Procaine]     Heart palpitations    Prednisone Other (See Comments)    Red flush area across her face   Sulfa Antibiotics Other (See Comments)    Sore in top of mouth   Penicillins Rash    SOCIAL HISTORY/FAMILY HISTORY   Reviewed in Epic:  Pertinent findings:  Social History   Tobacco Use   Smoking status: Never   Smokeless tobacco: Never  Substance Use Topics   Alcohol use: No   Drug use: No   Social History   Social History Narrative   She is a single woman. 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 She does walk, walking herdog, but has been bothered recently by her knee. She does not smoke, does not drink.       She still tries to walk 4-5 days a week and does this for about 30 minutes a day.     OBJCTIVE -PE, EKG, labs   Wt Readings from Last 3 Encounters:  01/15/22 238 lb (108 kg)  01/07/21 233 lb (105.7 kg)  11/08/19 245 lb 6.4 oz (111.3 kg)    Physical Exam: BP 115/62   Pulse 70   Ht 5' 1.5" (1.562 m)   Wt 238 lb (108 kg)   SpO2 91%   BMI 44.24 kg/m  Physical Exam Vitals  reviewed.  Constitutional:      General: She is not in acute distress.    Appearance: Normal appearance. She is obese. She is not ill-appearing (Well-groomed.  Healthy-appearing.  Morbidly obese) or toxic-appearing.  HENT:     Head: Normocephalic and atraumatic.  Neck:     Vascular: No carotid bruit or JVD.  Cardiovascular:     Rate and Rhythm: Normal rate and regular rhythm. No extrasystoles are present.    Chest Wall: PMI is not displaced.     Pulses: Normal pulses.     Heart sounds: Normal heart sounds, S1 normal and S2 normal. No murmur heard.    No friction rub. No gallop.  Pulmonary:     Effort: Pulmonary effort is normal. No respiratory distress.     Breath sounds: Normal breath sounds. No wheezing, rhonchi or rales.  Chest:     Chest wall: No tenderness.  Musculoskeletal:        General: Swelling (Trivial 1+) present. Normal range of motion.     Cervical back: Normal range of motion and neck supple.  Skin:    General: Skin is warm and dry.     Comments: Mild swollen varicose veins most notable right lower leg.  Mild venous stasis changes.   Neurological:     General: No focal deficit present.     Mental Status: She is alert and oriented to person, place, and time. Mental status is at baseline.     Gait: Gait normal.  Psychiatric:        Mood and Affect: Mood normal.        Behavior: Behavior normal.        Thought Content: Thought content normal.        Judgment: Judgment normal.     Comments: In good spirits.  Happy      Adult ECG Report  Rate: 70;  Rhythm: normal sinus rhythm and cannot exclude inferior MI, age-indeterminate. ;  Normal axis, intervals and durations  Narrative Interpretation: Stable  Recent Labs:   03/24/2021: TC 170, TG 171, HDL 50, LDL 92. 07/27/2021: A1c 6.8. No results found for: "CHOL", "HDL", "LDLCALC", "LDLDIRECT", "TRIG", "CHOLHDL" Lab Results  Component Value Date   CREATININE 0.71 07/06/2013   BUN 11 07/06/2013   NA 135 (L) 07/06/2013    K 3.6 (L) 07/06/2013   CL 96 07/06/2013   CO2 26 07/06/2013      Latest Ref Rng & Units 07/06/2013    4:10 AM 06/26/2013    9:10 AM 05/17/2013    2:39 PM  CBC  WBC 4.0 - 10.5 K/uL 14.4  11.7  11.5   Hemoglobin 12.0 - 15.0 g/dL 10.0  11.0  10.9   Hematocrit 36.0 - 46.0 % 30.4  34.0  33.5   Platelets 150 - 400 K/uL 344  420  391     No results found for: "HGBA1C" No results found for: "TSH"  ================================================== I spent a total of 14 minutes with the patient spent in direct patient consultation.  Additional time spent with chart review  / charting (studies, outside notes, etc): 15 min Total Time: 29 min  Current medicines are reviewed at length with the patient today.  (+/- concerns) none  Notice: This dictation was prepared with Dragon dictation along with smart phrase technology. Any transcriptional errors that result from this process are unintentional and may not be corrected upon review.  Studies Ordered:   Orders Placed This Encounter  Procedures   EKG 12-Lead   Meds ordered this encounter  Medications   irbesartan-hydrochlorothiazide (AVALIDE) 150-12.5 MG tablet    Sig: Take 1 tablet by mouth daily.    Dispense:  90 tablet  Refill:  3   metoprolol tartrate (LOPRESSOR) 50 MG tablet    Sig: Take 1 tablet (50 mg total) by mouth 2 (two) times daily.    Dispense:  180 tablet    Refill:  3    Patient Instructions / Medication Changes & Studies & Tests Ordered   Patient Instructions  Medication Instructions:   No changes Medication refilled   *If you need a refill on your cardiac medications before your next appointment, please call your pharmacy*   Lab Work:  Not needed     Testing/Procedures:  Not needed  Follow-Up: At [facility], you and your health needs are our priority.  As part of our continuing mission to provide you with exceptional heart care, we have created designated Provider Care Teams.  These Care  Teams include your primary Cardiologist (physician) and Advanced Practice Providers (APPs -  Physician Assistants and Nurse Practitioners) who all work together to provide you with the care you need, when you need it.  We recommend signing up for the patient portal called "MyChart".  Sign up information is provided on this After Visit Summary.  MyChart is used to connect with patients for Virtual Visits (Telemedicine).  Patients are able to view lab/test results, encounter notes, upcoming appointments, etc.  Non-urgent messages can be sent to your provider as well.   To learn more about what you can do with MyChart, go to [email or web address].    Your next appointment:   12 month(s)  The format for your next appointment:   In Person  Provider:   Glenetta Hew, MD      Leonie Man, MD, MS Glenetta Hew, M.D., M.S. Interventional Cardiologist  [place]  Pager # [phone] Phone # [phone] [address] [place], [place] [address]   Thank you for choosing [facility]!!

## 2022-02-08 ENCOUNTER — Encounter: Payer: Self-pay | Admitting: Cardiology

## 2022-02-08 NOTE — Assessment & Plan Note (Signed)
Most recent LDL was 92 which is well controlled on current dose of Zocor.  Low threshold to convert to either atorvastatin or rosuvastatin for better control and less side effect profile.

## 2022-02-08 NOTE — Assessment & Plan Note (Signed)
Multifactorial: Mostly related to obesity and deconditioning.  Has had negative ischemic evaluation in the past.  Continue to recommend diet, exercise and weight loss.  He has been doing really well with this but has fallen back some.  Knee pain is kept her from exercising.

## 2022-02-08 NOTE — Assessment & Plan Note (Signed)
Blood pressure looks good on current meds. Continue Avalide and Lopressor at current doses.

## 2022-02-08 NOTE — Assessment & Plan Note (Signed)
She had lost COVID awake, now back up some.  She is on Trulicity, may want to consider switching to Ozempic or Mounjaro for better weight loss.

## 2022-02-08 NOTE — Assessment & Plan Note (Signed)
Palpitations seem pretty well controlled on current dose of Lopressor.  No breakthrough episodes.  No requirement of additional beta-blocker.  Reiterated the importance of hydration as well as vagal maneuvers.

## 2022-02-08 NOTE — Assessment & Plan Note (Signed)
Well-controlled on current dose of Lopressor.  Not requiring additional dosing.  No change.

## 2022-02-08 NOTE — Assessment & Plan Note (Signed)
Major retractor modification and is now with diet and exercise leading to weight loss.  Lipids are pretty well controlled, blood pressure is also pretty well controlled.  Also close glycemic control.  She is on metformin and Trulicity.

## 2022-04-15 ENCOUNTER — Other Ambulatory Visit (HOSPITAL_COMMUNITY): Payer: Self-pay

## 2022-04-15 MED ORDER — TRULICITY 1.5 MG/0.5ML ~~LOC~~ SOAJ
1.5000 mg | SUBCUTANEOUS | 2 refills | Status: AC
  Filled 2022-04-15 – 2022-05-12 (×2): qty 2, 28d supply, fill #0
  Filled 2022-09-13: qty 2, 28d supply, fill #1

## 2022-05-12 ENCOUNTER — Other Ambulatory Visit: Payer: Self-pay

## 2022-05-12 MED ORDER — TRULICITY 1.5 MG/0.5ML ~~LOC~~ SOAJ
1.5000 mg | SUBCUTANEOUS | 6 refills | Status: DC
  Filled 2022-05-12 – 2022-06-09 (×3): qty 2, 28d supply, fill #0
  Filled 2022-07-13: qty 2, 28d supply, fill #1
  Filled 2022-08-16: qty 2, 28d supply, fill #2
  Filled 2022-10-08: qty 2, 28d supply, fill #3
  Filled 2022-11-05: qty 2, 28d supply, fill #4
  Filled 2022-12-06: qty 2, 28d supply, fill #5
  Filled 2022-12-31: qty 2, 28d supply, fill #6

## 2022-06-09 ENCOUNTER — Other Ambulatory Visit: Payer: Self-pay

## 2022-06-09 ENCOUNTER — Other Ambulatory Visit (HOSPITAL_COMMUNITY): Payer: Self-pay

## 2022-07-13 ENCOUNTER — Other Ambulatory Visit (HOSPITAL_COMMUNITY): Payer: Self-pay

## 2022-08-16 ENCOUNTER — Other Ambulatory Visit (HOSPITAL_COMMUNITY): Payer: Self-pay

## 2022-09-13 ENCOUNTER — Other Ambulatory Visit (HOSPITAL_COMMUNITY): Payer: Self-pay

## 2022-09-14 ENCOUNTER — Other Ambulatory Visit (HOSPITAL_COMMUNITY): Payer: Self-pay

## 2022-10-08 ENCOUNTER — Other Ambulatory Visit (HOSPITAL_COMMUNITY): Payer: Self-pay

## 2022-11-05 ENCOUNTER — Other Ambulatory Visit (HOSPITAL_COMMUNITY): Payer: Self-pay

## 2022-12-06 ENCOUNTER — Other Ambulatory Visit (HOSPITAL_COMMUNITY): Payer: Self-pay

## 2022-12-21 ENCOUNTER — Other Ambulatory Visit: Payer: Self-pay | Admitting: Cardiology

## 2022-12-31 ENCOUNTER — Other Ambulatory Visit (HOSPITAL_COMMUNITY): Payer: Self-pay

## 2022-12-31 ENCOUNTER — Other Ambulatory Visit: Payer: Self-pay | Admitting: Cardiology

## 2023-01-22 ENCOUNTER — Other Ambulatory Visit: Payer: Self-pay | Admitting: Cardiology

## 2023-01-26 ENCOUNTER — Other Ambulatory Visit: Payer: Self-pay | Admitting: Cardiology

## 2023-01-28 ENCOUNTER — Other Ambulatory Visit (HOSPITAL_COMMUNITY): Payer: Self-pay

## 2023-01-28 MED ORDER — TRULICITY 1.5 MG/0.5ML ~~LOC~~ SOAJ
1.5000 mg | SUBCUTANEOUS | 6 refills | Status: DC
  Filled 2023-01-28: qty 2, 28d supply, fill #0
  Filled 2023-02-25: qty 2, 28d supply, fill #1
  Filled 2023-03-27: qty 2, 28d supply, fill #2
  Filled 2023-04-25 (×2): qty 2, 28d supply, fill #3
  Filled 2023-05-20: qty 2, 28d supply, fill #4
  Filled 2023-06-20: qty 2, 28d supply, fill #5
  Filled 2023-07-18: qty 2, 28d supply, fill #6

## 2023-01-31 ENCOUNTER — Other Ambulatory Visit (HOSPITAL_COMMUNITY): Payer: Self-pay

## 2023-02-04 ENCOUNTER — Encounter: Payer: Self-pay | Admitting: Cardiology

## 2023-02-04 ENCOUNTER — Ambulatory Visit: Payer: Medicare HMO | Attending: Cardiology | Admitting: Cardiology

## 2023-02-04 VITALS — BP 132/77 | HR 70 | Ht 61.0 in | Wt 230.0 lb

## 2023-02-04 DIAGNOSIS — E8881 Metabolic syndrome: Secondary | ICD-10-CM

## 2023-02-04 DIAGNOSIS — I471 Supraventricular tachycardia, unspecified: Secondary | ICD-10-CM | POA: Diagnosis not present

## 2023-02-04 DIAGNOSIS — I10 Essential (primary) hypertension: Secondary | ICD-10-CM

## 2023-02-04 DIAGNOSIS — E1169 Type 2 diabetes mellitus with other specified complication: Secondary | ICD-10-CM

## 2023-02-04 DIAGNOSIS — R002 Palpitations: Secondary | ICD-10-CM | POA: Diagnosis not present

## 2023-02-04 DIAGNOSIS — H8103 Meniere's disease, bilateral: Secondary | ICD-10-CM

## 2023-02-04 DIAGNOSIS — E785 Hyperlipidemia, unspecified: Secondary | ICD-10-CM

## 2023-02-04 DIAGNOSIS — Z6841 Body Mass Index (BMI) 40.0 and over, adult: Secondary | ICD-10-CM

## 2023-02-04 DIAGNOSIS — E669 Obesity, unspecified: Secondary | ICD-10-CM

## 2023-02-04 NOTE — Patient Instructions (Signed)
Medication Instructions:  No changes  *If you need a refill on your cardiac medications before your next appointment, please call your pharmacy*   Lab Work: None needed    Testing/Procedures: None needed    Follow-Up: At [facility], you and your health needs are our priority.  As part of our continuing mission to provide you with exceptional heart care, we have created designated Provider Care Teams.  These Care Teams include your primary Cardiologist (physician) and Advanced Practice Providers (APPs -  Physician Assistants and Nurse Practitioners) who all work together to provide you with the care you need, when you need it.  We recommend signing up for the patient portal called "MyChart".  Sign up information is provided on this After Visit Summary.  MyChart is used to connect with patients for Virtual Visits (Telemedicine).  Patients are able to view lab/test results, encounter notes, upcoming appointments, etc.  Non-urgent messages can be sent to your provider as well.   To learn more about what you can do with MyChart, go to [email or web address].    Your next appointment:   1 year(s)  Provider:   Bryan Lemma, MD

## 2023-02-04 NOTE — Progress Notes (Unsigned)
Cardiology Office Note:  .   Date:  02/06/2023  ID:  [name], DOB May 04, 1950, MRN [id number] PCP: Diana Paradise, MD  [facility] Providers Cardiologist:  Bryan Lemma, MD     Chief Complaint  Patient presents with   Dizziness    Pt stated that she gets dizzy if she lays back to far or raises up after bending over   Follow-up    Annual follow-up.  No major cardiac issues.    Patient Profile: .     Diana Davidson is returns for annual Cardiology d/u at the request of Diana Paradise, MD.  [facility] notable for HTN, HLD, DM-2, morbid obesity and PSVT/PAT     Diana Davidson was last seen on January 15, 2022 for routine follow-up.  Doing well.  No major issues.  BP control.  Palpitations well-controlled.  Has not been exercising like she usually would like-"got off the bandwagon." Mostly noted activity limited by knee pain-right more than left.  Subjective  Discussed the use of AI scribe software for clinical note transcription with the patient, who gave verbal consent to proceed.  History of Present Illness   The patient is a morbidly obese woman with PMH of HTN, HLD & DM (=Metabolic Syndrome) who presents with a main complaint of vertigo, specifically experiencing episodes of dizziness when transitioning from a reclined to an upright position - described as a feeling like the world is spinning. This symptom was first noticed during recent dental procedures where the patient was reclined for an extended period. The patient denies any associated loss of consciousness or falls. The patient's internist suggested the possibility of an inner ear (vestibular) issue, specifically mentioning crystals, which the patient interpreted as vertigo.  The patient also reports occasional episodes of tachycardia, but denies associated dyspnea, dizziness, or chest pain. The patient remains active despite arthritis in the feet and arms. The patient denies any lower extremity swelling.  She is  deconditioned, and has baseline exertional dyspnea, but denies any chest pain/pressure or tightness at rest or with exertion.  Mild end of day edema, but denies PND or orthopnea.    The patient's current medication regimen includes metformin, Trulicity, irbesartan, hydrochlorothiazide, and metoprolol. The patient reported needing to take an extra dose of metoprolol once in the past year due to caffeine intake. The patient also takes simvastatin.  The patient has a history of diabetes, with recent blood sugars ranging from the low 90s to 100, and a last reported A1c of 6.7. The patient has lost eight pounds over the past year. The patient is due for a physical and lab work in January.     ROS:  Review of Systems - Negative except vertigo, arthritis pains.    Objective .  Current Meds  Medication Sig   acetaminophen (TYLENOL) 500 MG tablet Take 500 mg by mouth every 6 (six) hours as needed for moderate pain.   Calcium Citrate-Vitamin D (CALCIUM CITRATE + D3 PO) Take 2 tablets by mouth daily.    Cholecalciferol (VITAMIN D3) 2000 units TABS Take 2 tablets by mouth daily.   Coenzyme Q10 (CO Q 10) 100 MG CAPS Take 100 mg by mouth daily.    Dulaglutide (TRULICITY) 1.5 MG/0.5ML SOAJ Inject 1.5 mg into the skin once a week.   DULoxetine (CYMBALTA) 60 MG capsule Take 60 mg by mouth every morning.    esomeprazole (NEXIUM) 40 MG capsule Take 40 mg by mouth daily before breakfast.   irbesartan-hydrochlorothiazide (AVALIDE) 150-12.5  MG tablet TAKE 1 TABLET BY MOUTH EVERY DAY   LORazepam (ATIVAN) 1 MG tablet Take 1 mg by mouth 3 (three) times daily.    metFORMIN (GLUCOPHAGE) 500 MG tablet Take 500 mg by mouth 2 (two) times daily.   metoprolol tartrate (LOPRESSOR) 50 MG tablet Take 1 tablet (50 mg total) by mouth 2 (two) times daily.   simvastatin (ZOCOR) 20 MG tablet Take 20 mg by mouth every evening.   TRULICITY 1.5 MG/0.5ML SOPN Inject 1.5 mg into the skin once a week.   Studies Reviewed: Marland Kitchen   EKG  Interpretation Date/Time:  Friday February 04 2023 10:34:06 EST Ventricular Rate:  70 PR Interval:  136 QRS Duration:  86 QT Interval:  410 QTC Calculation: 442 R Axis:   24  Text Interpretation: Normal sinus rhythm Normal ECG No previous ECGs available Confirmed by Bryan Lemma ([id number]) on 02/04/2023 11:03:31 AM    No new studies Due for labs to be checked by PCP in January.  I do not see any labs were from last year.  Risk Assessment/Calculations:         Physical Exam:   VS:  BP 132/77 (BP Location: Left Arm, Patient Position: Sitting, Cuff Size: Large)   Pulse 70   Ht 5\' 1"  (1.549 m)   Wt 230 lb (104.3 kg)   SpO2 98%   BMI 43.46 kg/m    Wt Readings from Last 3 Encounters:  02/04/23 230 lb (104.3 kg)  01/15/22 238 lb (108 kg)  01/07/21 233 lb (105.7 kg)    GEN: Well nourished, well developed in no acute distress; morbidly obese; well-groomed. NECK: No JVD; No carotid bruits CARDIAC: Normal S1, S2; RRR, no murmurs, rubs, gallops RESPIRATORY:  Clear to auscultation without rales, wheezing or rhonchi ; nonlabored, good air movement. ABDOMEN: Soft, non-tender, non-distended EXTREMITIES:  trivial edema; No deformity ; mild varicose veins.    ASSESSMENT AND PLAN: .    Problem List Items Addressed This Visit       Cardiology Problems   Dyslipidemia, goal LDL below 130 but closer to 100 (Chronic)    Labs not checked this past year that I can see.  LDL have been at 92 on 20 mg rosuvastatin.  -Continue to monitor, would like to see LDL stay below 100 and triglycerides less than 150. -Continue 20 mg rosuvastatin for now.      Essential hypertension (Chronic)    Blood pressure well-controlled on current meds: -Continue irbesartan-HCTZ (150-12.5 mg) daily, Lopressor (metoprolol tartrate) 50 mg twice daily.      PSVT (paroxysmal supraventricular tachycardia) - history of (Chronic)    Occasional palpitations, no associated shortness of breath, dizziness, or chest pain.  Able to maintain daily activities. Only one extra dose of metoprolol required in the past year due to caffeine intake. -Continue current medications including metoprolol. -Avoid caffeine to prevent palpitations.        Other   Heart palpitations (Chronic)    Continue beta-blocker.  As long as she avoids triggers, she has not had any significant episodes.      Relevant Orders   EKG 12-Lead (Completed)   Metabolic syndrome: Obesity, hypertension, diabetes - Primary (Chronic)    Continue to stressed importance of diet modification and trying to increase level of exercise to lose weight.  Hopefully this would help control the other risk factors of HTN, DM and HLD.  Unfortunately she is limited by arthritis. Consider GLP-1 agonist.      Morbid obesity with BMI  of 45.0-49.9, adult [facility]) (Chronic)    She is back to the weight she was in 2022 which was reassuring, but continues to lose down at least another 30 pounds to get below 200 pounds.  Still have room to go after that, but this will get her out of some of the danger zone.  Consider GLP-1 agonist.-Could consider Ozempic/Mounjaro given the diagnosis of diabetes but also could consider Wegovy or Zepbound for obesity.      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 Recent A1c 6.7. Blood sugars running in the low 90s to 100. -Continue current medications. -Check A1c with primary care physician in January. -Consider GLP-1 agonist.      Vertigo, labyrinthine, bilateral (Chronic)    Episodes of dizziness when getting up from the dentist chair. No loss of consciousness or falls. Likely related to inner ear crystals. -No specific plan discussed.       Arthritis Affecting feet and arms, limiting activity. -No specific plan discussed.  General Health Maintenance -Continue current medications including irbesartan, hydrochlorothiazide, and simvastatin. -Primary care physician to check labs in  January.           Follow-Up: Return in about 1 year (around 02/04/2024) for 1 Yr Follow-up.  Total time spent: 16 min spent with patient + 15 min spent charting = 31 min     Signed, Marykay Lex, MD, MS Bryan Lemma, M.D., M.S. Interventional Cardiologist  [facility]  Pager # [phone] Phone # [phone] [address] [place], [place] [address]

## 2023-02-06 ENCOUNTER — Encounter: Payer: Self-pay | Admitting: Cardiology

## 2023-02-06 DIAGNOSIS — H8103 Meniere's disease, bilateral: Secondary | ICD-10-CM | POA: Insufficient documentation

## 2023-02-06 NOTE — Assessment & Plan Note (Signed)
She is back to the weight she was in 2022 which was reassuring, but continues to lose down at least another 30 pounds to get below 200 pounds.  Still have room to go after that, but this will get her out of some of the danger zone.  Consider GLP-1 agonist.-Could consider Ozempic/Mounjaro given the diagnosis of diabetes but also could consider Wegovy or Zepbound for obesity.

## 2023-02-06 NOTE — Assessment & Plan Note (Signed)
Blood pressure well-controlled on current meds: -Continue irbesartan-HCTZ (150-12.5 mg) daily, Lopressor (metoprolol tartrate) 50 mg twice daily.

## 2023-02-06 NOTE — Assessment & Plan Note (Signed)
Episodes of dizziness when getting up from the dentist chair. No loss of consciousness or falls. Likely related to inner ear crystals. -No specific plan discussed.

## 2023-02-06 NOTE — Assessment & Plan Note (Signed)
Continue beta-blocker.  As long as she avoids triggers, she has not had any significant episodes.

## 2023-02-06 NOTE — Assessment & Plan Note (Signed)
Occasional palpitations, no associated shortness of breath, dizziness, or chest pain. Able to maintain daily activities. Only one extra dose of metoprolol required in the past year due to caffeine intake. -Continue current medications including metoprolol. -Avoid caffeine to prevent palpitations.

## 2023-02-06 NOTE — Assessment & Plan Note (Signed)
On metformin and Trulicity. Recent A1c 6.7. Blood sugars running in the low 90s to 100. -Continue current medications. -Check A1c with primary care physician in January. -Consider GLP-1 agonist.

## 2023-02-06 NOTE — Assessment & Plan Note (Addendum)
Labs not checked this past year that I can see.  LDL have been at 92 on 20 mg rosuvastatin.  -Continue to monitor, would like to see LDL stay below 100 and triglycerides less than 150. -Continue 20 mg rosuvastatin for now.

## 2023-02-06 NOTE — Assessment & Plan Note (Signed)
Continue to stressed importance of diet modification and trying to increase level of exercise to lose weight.  Hopefully this would help control the other risk factors of HTN, DM and HLD.  Unfortunately she is limited by arthritis. Consider GLP-1 agonist.

## 2023-02-23 ENCOUNTER — Other Ambulatory Visit: Payer: Self-pay | Admitting: Cardiology

## 2023-02-25 ENCOUNTER — Other Ambulatory Visit (HOSPITAL_COMMUNITY): Payer: Self-pay

## 2023-03-19 ENCOUNTER — Other Ambulatory Visit: Payer: Self-pay | Admitting: Cardiology

## 2023-03-28 ENCOUNTER — Other Ambulatory Visit (HOSPITAL_COMMUNITY): Payer: Self-pay

## 2023-04-25 ENCOUNTER — Other Ambulatory Visit (HOSPITAL_COMMUNITY): Payer: Self-pay

## 2023-05-20 ENCOUNTER — Other Ambulatory Visit (HOSPITAL_COMMUNITY): Payer: Self-pay

## 2023-06-07 ENCOUNTER — Other Ambulatory Visit: Payer: Self-pay

## 2023-06-07 MED ORDER — LORAZEPAM 1 MG PO TABS
1.0000 mg | ORAL_TABLET | Freq: Two times a day (BID) | ORAL | 0 refills | Status: AC | PRN
  Filled 2023-06-07: qty 60, 30d supply, fill #0

## 2023-06-08 ENCOUNTER — Other Ambulatory Visit: Payer: Self-pay

## 2023-08-12 ENCOUNTER — Other Ambulatory Visit (HOSPITAL_COMMUNITY): Payer: Self-pay

## 2023-08-15 ENCOUNTER — Other Ambulatory Visit (HOSPITAL_COMMUNITY): Payer: Self-pay

## 2023-08-15 MED ORDER — TRULICITY 1.5 MG/0.5ML ~~LOC~~ SOAJ
1.5000 mg | SUBCUTANEOUS | 6 refills | Status: DC
  Filled 2023-08-15: qty 2, 28d supply, fill #0
  Filled 2023-09-12: qty 2, 28d supply, fill #1
  Filled 2023-10-10: qty 2, 28d supply, fill #2
  Filled 2023-11-03: qty 2, 28d supply, fill #3
  Filled 2023-12-05: qty 2, 28d supply, fill #4
  Filled 2024-01-02: qty 2, 28d supply, fill #5
  Filled 2024-01-30: qty 2, 28d supply, fill #6

## 2023-09-13 ENCOUNTER — Other Ambulatory Visit (HOSPITAL_COMMUNITY): Payer: Self-pay

## 2023-11-03 ENCOUNTER — Other Ambulatory Visit (HOSPITAL_COMMUNITY): Payer: Self-pay

## 2023-11-04 ENCOUNTER — Other Ambulatory Visit (HOSPITAL_COMMUNITY): Payer: Self-pay

## 2024-02-10 ENCOUNTER — Other Ambulatory Visit: Payer: Self-pay | Admitting: Cardiology

## 2024-02-15 ENCOUNTER — Encounter: Payer: Self-pay | Admitting: Cardiology

## 2024-02-15 ENCOUNTER — Ambulatory Visit: Attending: Cardiology | Admitting: Cardiology

## 2024-02-15 VITALS — BP 136/68 | HR 85 | Ht 61.0 in | Wt 237.4 lb

## 2024-02-15 DIAGNOSIS — I471 Supraventricular tachycardia, unspecified: Secondary | ICD-10-CM

## 2024-02-15 DIAGNOSIS — E785 Hyperlipidemia, unspecified: Secondary | ICD-10-CM

## 2024-02-15 DIAGNOSIS — R002 Palpitations: Secondary | ICD-10-CM

## 2024-02-15 DIAGNOSIS — R600 Localized edema: Secondary | ICD-10-CM

## 2024-02-15 DIAGNOSIS — E8881 Metabolic syndrome: Secondary | ICD-10-CM | POA: Diagnosis not present

## 2024-02-15 DIAGNOSIS — I10 Essential (primary) hypertension: Secondary | ICD-10-CM

## 2024-02-15 DIAGNOSIS — E119 Type 2 diabetes mellitus without complications: Secondary | ICD-10-CM

## 2024-02-15 DIAGNOSIS — Z6841 Body Mass Index (BMI) 40.0 and over, adult: Secondary | ICD-10-CM

## 2024-02-15 NOTE — Patient Instructions (Signed)
Medication Instructions:  No changes *If you need a refill on your cardiac medications before your next appointment, please call your pharmacy*  Lab Work: Not needed If you have labs (blood work) drawn today and your tests are completely normal, you will receive your results only by: . MyChart Message (if you have MyChart) OR . A paper copy in the mail If you have any lab test that is abnormal or we need to change your treatment, we will call you to review the results.  Testing/Procedures: Not needed  Follow-Up: At [facility], you and your health needs are our priority.  As part of our continuing mission to provide you with exceptional heart care, we have created designated Provider Care Teams.  These Care Teams include your primary Cardiologist (physician) and Advanced Practice Providers (APPs -  Physician Assistants and Nurse Practitioners) who all work together to provide you with the care you need, when you need it.  Your next appointment:   12 month(s)  The format for your next appointment:   In Person  Provider:   David Harding, MD  Other Instructions  

## 2024-02-15 NOTE — Progress Notes (Unsigned)
 Cardiology Office Note:  .   Date:  02/20/2024  ID:  CASADY VOSHELL, DOB May 08, 1950, MRN [id number] PCP: Shepard Ade, MD  [facility] Providers Cardiologist:  Alm Clay, MD     Chief Complaint  Patient presents with   Follow-up   Hypertension    Patient Profile: .     Diana Davidson is a morbidly obese 73 y.o. female  with a PMH notable for HTN, HLD, DM-2, off & on PSVT/PAT who presents here for annual follow-up at the request of Shepard Ade, MD.      Diana Davidson was last seen on November 2024.  Doing relatively well.  No changes.  Subjective  Discussed the use of AI scribe software for clinical note transcription with the patient, who gave verbal consent to proceed.  History of Present Illness Diana Davidson is a 73 year old female who presents with palpitations and leg swelling.  She has experienced palpitations for about two weeks, which were frequent but have since resolved. She reports that it has been a challenging year due to personal losses and family health issues.  She experiences swelling in her feet when sitting for prolonged periods, especially if she does not elevate her feet or walk. The swelling is exacerbated by salt intake. No shortness of breath when lying flat or waking up short of breath.  Her legs feel heavy and tired when walking, particularly over long distances, but she denies cramping. She attributes some of the discomfort to her fibromyalgia, which causes her pain.  Her blood pressure at home usually ranges between 125 to 135 systolic. Her current medications include Trulicity , simvastatin 20 mg, metoprolol  tartrate 50 mg twice a day, metformin 500 mg twice a day, and irbesartan /HCTZ 150/12.5 mg once a day.   Cardiovascular ROS: no chest pain or dyspnea on exertion positive for - edema, palpitations, and edema associated with leg heaviness and fatigue.  Probably related to fibromyalgia. negative for - irregular heartbeat, orthopnea,  paroxysmal nocturnal dyspnea, rapid heart rate, shortness of breath, or lightheadedness, dizziness or syncope/near syncope, TIA/amaurosis fugax, claudication  ROS:  Review of Systems - negative from noted above.    Objective   Medications: BP: Metoprolol  tartrate 50 mg twice daily; irbesartan -HCTZ 150-12.5 mg TM: Metformin 500 mg twice daily, Trulicity  1.5 mg weekly Simvastatin 20 mg daily Nexium 40 mg daily Cymbalta  60 mg daily As needed Ativan  1 mg up to 2 times daily as needed  Studies Reviewed: SABRA   EKG Interpretation Date/Time:  Wednesday February 15 2024 15:50:27 EST Ventricular Rate:  85 PR Interval:  142 QRS Duration:  90 QT Interval:  374 QTC Calculation: 445 R Axis:   8  Text Interpretation: Normal sinus rhythm Cannot rule out Inferior infarct , age undetermined When compared with ECG of 04-Feb-2023 10:34, No significant change since last tracing Confirmed by Clay Alm ([id number]) on 02/15/2024 4:31:15 PM    No recent studies  Most recent labs are from January 2025 with total cholesterol 184, TG 210, HDL 61 and LDL 61.  A1c 6.6.  Risk Assessment/Calculations:           Physical Exam:   VS:  BP 136/68   Pulse 85   Ht 5' 1 (1.549 m)   Wt 237 lb 6.4 oz (107.7 kg)   SpO2 99%   BMI 44.86 kg/m    Wt Readings from Last 3 Encounters:  02/15/24 237 lb 6.4 oz (107.7 kg)  02/04/23 230 lb (104.3 kg)  01/15/22 238 lb (108 kg)      GEN: Well nourished, well groomed; in no acute distress; morbidly obese but otherwise healthy appearing. NECK: No JVD; No carotid bruits CARDIAC: Normal S1, S2; RRR, no murmurs, rubs, gallops RESPIRATORY:  Clear to auscultation without rales, wheezing or rhonchi ; nonlabored, good air movement. ABDOMEN: Soft, non-tender, non-distended EXTREMITIES:  No edema; No deformity     ASSESSMENT AND PLAN: .    Problem List Items Addressed This Visit       Cardiology Problems   Dyslipidemia, goal LDL below 130 but closer to 100 (Chronic)    LDL relatively well-controlled on current dose of 20 mg simvastatin.  Low threshold to convert if lipids get worse.. - Continue simvastatin 20 mg daily.      Essential hypertension - Primary (Chronic)   Blood pressure at home ranges 125-135 mmHg systolic, better than current reading. No orthostatic hypotension or complications. - Continue irbesartan /HCTZ and metoprolol  tartrate.      Relevant Orders   EKG 12-Lead (Completed)   PSVT (paroxysmal supraventricular tachycardia) - history of (Chronic)   Pretty much controlled with current dose of metoprolol .  Rare episodes.  - Continue metoprolol  to tartrate 50 mg twice daily        Other   Bilateral lower extremity edema (Chronic)   Intermittent foot swelling, improves with leg elevation and reduced salt intake. - Advised leg elevation and reduced salt intake.      Diabetes mellitus type II, controlled-associated with obesity (HCC) (Chronic)   Managed with metformin and Trulicity . No acute issues. - Continue metformin and Trulicity .      Heart palpitations (Chronic)   Still present, but relatively well-controlled on current dose of metoprolol  tartrate 50 mg twice daily      Metabolic syndrome: Obesity, hypertension, diabetes (Chronic)   Morbid obesity with BMI of 45.0-49.9, adult (HCC) (Chronic)   Continue distress importance of diet and exercise.  With diabetes, would potentially consider switching from Trulicity  to Mounjaro.            Follow-Up: Return in about 1 year (around 02/14/2025) for [facility] with MD or APP.     Signed, Alm MICAEL Clay, MD, MS Alm Clay, M.D., M.S. Interventional Cardiologist  [facility] Pager # [phone]

## 2024-02-20 ENCOUNTER — Encounter: Payer: Self-pay | Admitting: Cardiology

## 2024-02-20 DIAGNOSIS — R600 Localized edema: Secondary | ICD-10-CM | POA: Insufficient documentation

## 2024-02-20 NOTE — Assessment & Plan Note (Addendum)
 LDL relatively well-controlled on current dose of 20 mg simvastatin.  Low threshold to convert if lipids get worse.. - Continue simvastatin 20 mg daily.

## 2024-02-20 NOTE — Assessment & Plan Note (Signed)
 Blood pressure at home ranges 125-135 mmHg systolic, better than current reading. No orthostatic hypotension or complications. - Continue irbesartan /HCTZ and metoprolol  tartrate.

## 2024-02-20 NOTE — Assessment & Plan Note (Signed)
 Pretty much controlled with current dose of metoprolol .  Rare episodes.  - Continue metoprolol  to tartrate 50 mg twice daily

## 2024-02-20 NOTE — Assessment & Plan Note (Signed)
 Still present, but relatively well-controlled on current dose of metoprolol  tartrate 50 mg twice daily

## 2024-02-20 NOTE — Assessment & Plan Note (Signed)
 Continue distress importance of diet and exercise.  With diabetes, would potentially consider switching from Trulicity  to Mounjaro.

## 2024-02-20 NOTE — Assessment & Plan Note (Signed)
 Intermittent foot swelling, improves with leg elevation and reduced salt intake. - Advised leg elevation and reduced salt intake.

## 2024-02-20 NOTE — Assessment & Plan Note (Signed)
 Managed with metformin and Trulicity . No acute issues. - Continue metformin and Trulicity .

## 2024-03-01 ENCOUNTER — Other Ambulatory Visit: Payer: Self-pay | Admitting: Cardiology

## 2024-03-05 ENCOUNTER — Other Ambulatory Visit (HOSPITAL_COMMUNITY): Payer: Self-pay

## 2024-03-05 ENCOUNTER — Other Ambulatory Visit: Payer: Self-pay

## 2024-03-05 MED ORDER — TRULICITY 1.5 MG/0.5ML ~~LOC~~ SOAJ
1.5000 mg | SUBCUTANEOUS | 6 refills | Status: AC
  Filled 2024-03-05: qty 2, 28d supply, fill #0

## 2024-03-05 MED ORDER — TRULICITY 1.5 MG/0.5ML ~~LOC~~ SOAJ
1.5000 mg | SUBCUTANEOUS | 3 refills | Status: AC
  Filled 2024-03-05: qty 2, 28d supply, fill #0
  Filled 2024-03-30: qty 2, 28d supply, fill #1

## 2024-03-08 ENCOUNTER — Other Ambulatory Visit: Payer: Self-pay | Admitting: Cardiology

## 2024-03-30 ENCOUNTER — Other Ambulatory Visit (HOSPITAL_COMMUNITY): Payer: Self-pay
